# Patient Record
Sex: Male | Born: 1955 | Race: Black or African American | Hispanic: No | Marital: Married | Smoking: Former smoker
Health system: Southern US, Community
[De-identification: ages and names within clinical notes are randomized; demographics above are authoritative.]

## PROBLEM LIST (undated history)

## (undated) DIAGNOSIS — E785 Hyperlipidemia, unspecified: Secondary | ICD-10-CM

## (undated) HISTORY — PX: SHOULDER ARTHROSCOPY: SHX128

## (undated) HISTORY — PX: COLONOSCOPY: SHX174

## (undated) HISTORY — PX: OTHER SURGICAL HISTORY: SHX169

## (undated) HISTORY — DX: Hyperlipidemia, unspecified: E78.5

---

## 2004-08-27 ENCOUNTER — Ambulatory Visit (HOSPITAL_BASED_OUTPATIENT_CLINIC_OR_DEPARTMENT_OTHER): Admission: RE | Admit: 2004-08-27 | Discharge: 2004-08-28 | Payer: Self-pay | Admitting: Orthopedic Surgery

## 2004-08-27 ENCOUNTER — Ambulatory Visit (HOSPITAL_COMMUNITY): Admission: RE | Admit: 2004-08-27 | Discharge: 2004-08-27 | Payer: Self-pay | Admitting: Orthopedic Surgery

## 2017-06-29 ENCOUNTER — Emergency Department (HOSPITAL_BASED_OUTPATIENT_CLINIC_OR_DEPARTMENT_OTHER)
Admission: EM | Admit: 2017-06-29 | Discharge: 2017-06-29 | Disposition: A | Discharge status: Home / Self Care | Payer: Worker's Compensation | Attending: Emergency Medicine | Admitting: Emergency Medicine

## 2017-06-29 ENCOUNTER — Other Ambulatory Visit: Payer: Self-pay

## 2017-06-29 ENCOUNTER — Emergency Department (HOSPITAL_BASED_OUTPATIENT_CLINIC_OR_DEPARTMENT_OTHER): Payer: Worker's Compensation

## 2017-06-29 DIAGNOSIS — S4992XA Unspecified injury of left shoulder and upper arm, initial encounter: Secondary | ICD-10-CM

## 2017-06-29 DIAGNOSIS — Y998 Other external cause status: Secondary | ICD-10-CM | POA: Insufficient documentation

## 2017-06-29 DIAGNOSIS — I10 Essential (primary) hypertension: Secondary | ICD-10-CM | POA: Diagnosis not present

## 2017-06-29 DIAGNOSIS — W000XXA Fall on same level due to ice and snow, initial encounter: Secondary | ICD-10-CM | POA: Diagnosis not present

## 2017-06-29 DIAGNOSIS — Y929 Unspecified place or not applicable: Secondary | ICD-10-CM | POA: Insufficient documentation

## 2017-06-29 DIAGNOSIS — Y9301 Activity, walking, marching and hiking: Secondary | ICD-10-CM | POA: Insufficient documentation

## 2017-06-29 DIAGNOSIS — O161 Unspecified maternal hypertension, first trimester: Secondary | ICD-10-CM

## 2017-06-29 NOTE — ED Triage Notes (Signed)
Pt slipped on ice this morning and fell on his left shoulder

## 2017-06-29 NOTE — Discharge Instructions (Signed)
Follow up with the sports medicine doctor as soon as possible

## 2017-06-29 NOTE — ED Notes (Signed)
PMS intact before and after. Pt tolerated well. All questions answered. 

## 2017-06-29 NOTE — ED Provider Notes (Signed)
[facility] EMERGENCY DEPARTMENT Provider Note   CSN: [id number] Arrival date & time: 06/29/17  1028     History   Chief Complaint Chief Complaint  Patient presents with  . Shoulder Pain    HPI Isaac Chan is a 62 y.o. male presents the for evaluation of left shoulder injury.  Patient was at work this morning when he slipped on ice falling onto his left shoulder.  He denies hitting his head or losing consciousness.  He denies numbness, tingling or weakness.  He had an injury to the same shoulder about 20 years ago however has had no problems with it since the original injury.  Patient is noted to be hypertensive.  He states that he has not been diagnosed with this before.  He is uninsured and does not follow with a primary care physician.  He denies chest pain, shortness of breath.  HPI  No past medical history on file.  There are no active problems to display for this patient.        Home Medications    Prior to Admission medications   Not on File    Family History No family history on file.  Social History Social History   Tobacco Use  . Smoking status: Not on file  Substance Use Topics  . Alcohol use: Not on file  . Drug use: Not on file     Allergies   Patient has no known allergies.   Review of Systems Review of Systems Ten systems reviewed and are negative for acute change, except as noted in the HPI.    Physical Exam Updated Vital Signs BP (!) 194/112 (BP Location: Right Arm) Comment: PA made aware  Pulse (!) 51   Temp 98 F (36.7 C) (Oral)   Resp 16   Ht 5\' 9"  (1.753 m)   Wt 77.6 kg (171 lb)   SpO2 100%   BMI 25.25 kg/m   Physical Exam  Physical Exam  Nursing note and vitals reviewed. Constitutional: He appears well-developed and well-nourished. No distress.  HENT:  Head: Normocephalic and atraumatic.  Eyes: Conjunctivae normal are normal. No scleral icterus.  Neck: Normal range of motion. Neck supple.    Cardiovascular: Normal rate, regular rhythm and normal heart sounds.   Pulmonary/Chest: Effort normal and breath sounds normal. No respiratory distress.  Abdominal: Soft. There is no tenderness.  Musculoskeletal: He exhibits no edema. No tenderness to palpation of the structures of the left shoulder girdle, no deformities noted.  Pain with abduction and extension of the glenohumeral joint.  Normal and equal bilateral grip strength, neurovascularly intact Neurological: He is alert.  Skin: Skin is warm and dry. He is not diaphoretic.  Psychiatric: His behavior is normal.    ED Treatments / Results  Labs (all labs ordered are listed, but only abnormal results are displayed) Labs Reviewed - No data to display  EKG  EKG Interpretation None       Radiology Dg Shoulder Left  Result Date: 06/29/2017 CLINICAL DATA:  62 year old male with history of trauma to the left shoulder after slipping on ice this morning. EXAM: LEFT SHOULDER - 2+ VIEW COMPARISON:  None. FINDINGS: There is no evidence of fracture or dislocation. 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 Os acromiale (normal anatomical variant) incidentally noted. Soft tissues are unremarkable. IMPRESSION: Negative. Electronically Signed   By: Vinnie Langton M.D.   On: 06/29/2017 11:12    Procedures Procedures (including critical care time)  Medications Ordered  in ED Medications - No data to display   Initial Impression / Assessment and Plan / ED Course  I have reviewed the triage vital signs and the nursing notes.  Pertinent labs & imaging results that were available during my care of the patient were reviewed by me and considered in my medical decision making (see chart for details).     Patient X-Ray negative for obvious fracture or dislocation. Pain managed in ED. Pt advised to follow up with orthopedics if symptoms persist for possibility of missed fracture diagnosis. Patient given brace  while in ED, conservative therapy recommended and discussed. Patient will be dc home & is agreeable with above plan.   Final Clinical Impressions(s) / ED Diagnoses   Final diagnoses:  None    ED Discharge Orders    None       Margarita Mail, PA-C 06/29/17 1215    Fredia Sorrow, MD 07/01/17 1715

## 2017-07-02 ENCOUNTER — Ambulatory Visit (INDEPENDENT_AMBULATORY_CARE_PROVIDER_SITE_OTHER): Payer: Worker's Compensation | Admitting: Family Medicine

## 2017-07-02 ENCOUNTER — Encounter: Payer: Self-pay | Admitting: Family Medicine

## 2017-07-02 DIAGNOSIS — M25512 Pain in left shoulder: Secondary | ICD-10-CM

## 2017-07-02 DIAGNOSIS — S4992XA Unspecified injury of left shoulder and upper arm, initial encounter: Secondary | ICD-10-CM

## 2017-07-02 HISTORY — DX: Unspecified injury of left shoulder and upper arm, initial encounter: S49.92XA

## 2017-07-02 NOTE — Progress Notes (Addendum)
PCP: Patient, [name]  Subjective:   HPI: Patient is a 62 y.o. male here for left shoulder injury.  Patient reports on 1/21 he was at work, accidentally slipped on ice and landed directly onto left shoulder. Immediate pain and inability to move left arm fully. Took advil initially but nothing since. No night pain. Wearing sling from the ED. Right handed. Pain level 2/10 and sharp if trying to reach overhead. No skin changes, numbness.  History reviewed. No pertinent past medical history.  No current outpatient medications on file prior to visit.   No current facility-administered medications on file prior to visit.     History reviewed. No pertinent surgical history.  No Known Allergies  Social History   Socioeconomic History  . Marital status: Married    Spouse name: Not on file  . Number of children: Not on file  . Years of education: Not on file  . Highest education level: Not on file  Social Needs  . Financial resource strain: Not on file  . Food insecurity - worry: Not on file  . Food insecurity - inability: Not on file  . Transportation needs - medical: Not on file  . Transportation needs - non-medical: Not on file  Occupational History  . Not on file  Tobacco Use  . Smoking status: Current Every Day Smoker  . Smokeless tobacco: Never Used  Substance and Sexual Activity  . Alcohol use: Not on file  . Drug use: Not on file  . Sexual activity: Not on file  Other Topics Concern  . Not on file  Social History Narrative  . Not on file    History reviewed. No pertinent family history.  BP (!) 174/102   Pulse (!) 54   Ht 5\' 9"  (1.753 m)   Wt 171 lb (77.6 kg)   BMI 25.25 kg/m   Review of Systems: See HPI above.     Objective:  Physical Exam:  Gen: NAD, comfortable in exam room  Left shoulder: No swelling, ecchymoses.  No gross deformity. No TTP. Full IR and ER.  Difficulty with abduction beyond 30 degrees, very slow and involves flexion to  get to 90 degrees. Negative Hawkins, Neers. Negative Yergasons. Strength 5/5 with IR.  3/5 strength with empty can, 4/5 ER. Negative apprehension. NV intact distally.  Right shoulder: No swelling, ecchymoses.  No gross deformity. No TTP. FROM. Strength 5/5 with empty can and resisted internal/external rotation. NV intact distally.   MSK u/s:  Biceps tendon intact on long and trans views.  AC joint normal.  Subscapularis appears normal without impingement.  Infraspinatus very thin near insertion and does not move fluidly with ER, thickened proximally suggesting mid-substance tear.  Supraspinatus difficult to visualize with a lot of fluid at insertion on footplate - concerning for retracted tear.  Assessment & Plan:  1. Left shoulder injury - Independently reviewed radiographs and no evidence fracture.  Due to a fall at work directly onto his left shoulder.  Exam and ultrasound concerning for tears of supraspinatus and infraspinatus.  Will go ahead with MRI to assess.  Icing, ibuprofen and/or tylenol.  Sling as needed.    Addendum:  MRI reviewed and discussed with patient.  Unfortunately confirmed full thickness supraspinatus and infraspinatus tears.  We will refer him to ortho to discuss operative repair - discussed with 4cm of retraction this will be more difficult.

## 2017-07-02 NOTE — Assessment & Plan Note (Signed)
Independently reviewed radiographs and no evidence fracture.  Due to a fall at work directly onto his left shoulder.  Exam and ultrasound concerning for tears of supraspinatus and infraspinatus.  Will go ahead with MRI to assess.  Icing, ibuprofen and/or tylenol.  Sling as needed.

## 2017-07-02 NOTE — Patient Instructions (Signed)
I'm concerned you tore two of your rotator cuff muscles. We will go ahead with an MRI to further assess - I will call you with the results and next steps. Icing 15 minutes at a time 3-4 times a day. Ibuprofen and/or tylenol if needed for pain. Use sling as needed. Follow up will depend on the MRI results.

## 2017-07-03 ENCOUNTER — Ambulatory Visit: Payer: Self-pay

## 2017-07-03 DIAGNOSIS — S4992XA Unspecified injury of left shoulder and upper arm, initial encounter: Secondary | ICD-10-CM

## 2017-07-03 NOTE — Addendum Note (Signed)
Addended by: Cyd Silence on: 07/03/2017 11:23 AM   Modules accepted: Orders

## 2017-07-03 NOTE — Addendum Note (Signed)
Addended by: Sherrie George F on: 07/03/2017 09:17 AM   Modules accepted: Orders

## 2017-07-03 NOTE — Addendum Note (Signed)
Addended by: Cyd Silence on: 07/03/2017 10:49 AM   Modules accepted: Orders

## 2017-07-06 NOTE — Addendum Note (Signed)
Addended by: Sherrie George F on: 07/06/2017 10:43 AM   Modules accepted: Orders

## 2017-07-13 NOTE — Addendum Note (Signed)
Addended by: Sherrie George F on: 07/13/2017 01:51 PM   Modules accepted: Orders

## 2017-07-15 ENCOUNTER — Encounter: Payer: Self-pay | Admitting: Family Medicine

## 2017-12-17 ENCOUNTER — Other Ambulatory Visit: Payer: Self-pay

## 2017-12-17 ENCOUNTER — Encounter (HOSPITAL_BASED_OUTPATIENT_CLINIC_OR_DEPARTMENT_OTHER): Payer: Self-pay | Admitting: Emergency Medicine

## 2017-12-17 ENCOUNTER — Emergency Department (HOSPITAL_BASED_OUTPATIENT_CLINIC_OR_DEPARTMENT_OTHER)
Admission: EM | Admit: 2017-12-17 | Discharge: 2017-12-17 | Disposition: A | Discharge status: Home / Self Care | Payer: Self-pay | Attending: Emergency Medicine | Admitting: Emergency Medicine

## 2017-12-17 DIAGNOSIS — Y999 Unspecified external cause status: Secondary | ICD-10-CM | POA: Insufficient documentation

## 2017-12-17 DIAGNOSIS — F17200 Nicotine dependence, unspecified, uncomplicated: Secondary | ICD-10-CM | POA: Insufficient documentation

## 2017-12-17 DIAGNOSIS — S39012A Strain of muscle, fascia and tendon of lower back, initial encounter: Secondary | ICD-10-CM | POA: Insufficient documentation

## 2017-12-17 DIAGNOSIS — X58XXXA Exposure to other specified factors, initial encounter: Secondary | ICD-10-CM | POA: Insufficient documentation

## 2017-12-17 DIAGNOSIS — Y929 Unspecified place or not applicable: Secondary | ICD-10-CM | POA: Insufficient documentation

## 2017-12-17 DIAGNOSIS — Y939 Activity, unspecified: Secondary | ICD-10-CM | POA: Insufficient documentation

## 2017-12-17 MED ORDER — CYCLOBENZAPRINE HCL 10 MG PO TABS: 10.0000 mg | ORAL_TABLET | Freq: Two times a day (BID) | ORAL | 0 refills | Status: DC | PRN

## 2017-12-17 NOTE — ED Triage Notes (Signed)
R lower back pain since yesterday. Denies injury.

## 2017-12-17 NOTE — ED Notes (Signed)
Pt. Reports he got up yesterday morning and took a shower and his lower back started to hurt and now he has pain with walking.  Not as bad today as yesterday.  Pt. Went on to work but could not stay he reports.

## 2017-12-17 NOTE — Discharge Instructions (Addendum)
Continue aleve for pain. Flexeril for muscle spasms. Follow up with a family doctor. Return if worsening.

## 2017-12-17 NOTE — ED Provider Notes (Signed)
[name] EMERGENCY DEPARTMENT Provider Note   CSN: [id number] Arrival date & time: 12/17/17  1230     History   Chief Complaint Chief Complaint  Patient presents with  . Back Pain    HPI Isaac Chan is a 62 y.o. male.  HPI Isaac Chan is a 62 y.o. male presents to emergency department with complaint of back pain.  Patient states that he noted lower back pain yesterday, states that time was severe.  Today the pain is better.  He took Advil which has helped.  He states pain is worsened with movement.  Denies any urinary symptoms.  Denies any flank pain.  Denies any hematuria.  No abdominal pain.  No injuries.  States pain does not radiate to his extremities.  No numbness or weakness in his legs.  No saddle anesthesia.  No difficulty controlling his bowels or bladder.  No fever or chills.  He does not use IV drugs  History reviewed. No pertinent past medical history.  Patient Active Problem List   Diagnosis Date Noted  . Shoulder injury, left, initial encounter 07/02/2017    Past Surgical History:  Procedure Laterality Date  . SHOULDER ARTHROSCOPY Left         Home Medications    Prior to Admission medications   Medication Sig Start Date End Date Taking? Authorizing Provider  cyclobenzaprine (FLEXERIL) 10 MG tablet Take 1 tablet (10 mg total) by mouth 2 (two) times daily as needed for muscle spasms. 12/17/17   Jeannett Senior, PA-C    Family History No family history on file.  Social History Social History   Tobacco Use  . Smoking status: Current Every Day Smoker  . Smokeless tobacco: Never Used  Substance Use Topics  . Alcohol use: Not on file  . Drug use: Not on file     Allergies   Patient has no known allergies.   Review of Systems Review of Systems  Constitutional: Negative for chills and fever.  Respiratory: Negative for cough, chest tightness and shortness of breath.   Cardiovascular: Negative for chest pain, palpitations and  leg swelling.  Gastrointestinal: Negative for abdominal distention, abdominal pain, diarrhea, nausea and vomiting.  Genitourinary: Negative for dysuria, flank pain, frequency, hematuria and urgency.  Musculoskeletal: Positive for arthralgias and back pain. Negative for myalgias, neck pain and neck stiffness.  Skin: Negative for rash.  Allergic/Immunologic: Negative for immunocompromised state.  Neurological: Negative for dizziness, weakness, light-headedness, numbness and headaches.  All other systems reviewed and are negative.    Physical Exam Updated Vital Signs BP (!) 186/112 (BP Location: Right Arm)   Pulse (!) 50   Temp 98.3 F (36.8 C) (Oral)   Resp 18   Ht 5\' 9"  (1.753 m)   Wt 71.7 kg (158 lb)   SpO2 100%   BMI 23.33 kg/m   Physical Exam  Constitutional: He appears well-developed and well-nourished. No distress.  HENT:  Head: Normocephalic and atraumatic.  Eyes: Conjunctivae are normal.  Neck: Normal range of motion. Neck supple.  Cardiovascular: Normal rate, regular rhythm and normal heart sounds.  Pulmonary/Chest: Effort normal and breath sounds normal. No respiratory distress. He has no wheezes. He has no rales.  Abdominal: Soft. Bowel sounds are normal. He exhibits no distension. There is no tenderness. There is no rebound.  Musculoskeletal: He exhibits no edema.  ttp diffusely over lumbar back muscles. No midline tenderness. No pain with bilateral straight leg raise  Neurological: He is alert.  5/5 and equal lower  extremity strength. 2+ and equal patellar reflexes bilaterally. Pt able to dorsiflex bilateral toes and feet with good strength against resistance. Equal sensation bilaterally over thighs and lower legs.   Skin: Skin is warm and dry.  Nursing note and vitals reviewed.    ED Treatments / Results  Labs (all labs ordered are listed, but only abnormal results are displayed) Labs Reviewed - No data to display  EKG None  Radiology No results  found.  Procedures Procedures (including critical care time)  Medications Ordered in ED Medications - No data to display   Initial Impression / Assessment and Plan / ED Course  I have reviewed the triage vital signs and the nursing notes.  Pertinent labs & imaging results that were available during my care of the patient were reviewed by me and considered in my medical decision making (see chart for details).     Patient with atraumatic lower back pain.  No abdominal pain or tenderness.  No neuro deficits on exam.  No concern for cauda equina, no numbness or weakness in extremities, no loss of sensation to perineum, no trouble controlling bowels or bladder.  Patient does not have a fever.  He is hypertensive, and will follow up with his primary care doctor.  Will treat with NSAIDs and muscle relaxants.  Most likely muscular pain, states he feels like spasms.  States pain today much improved from yesterday.  He is ambulatory in the department with no difficulty.  Stable for discharge home with close outpatient follow-up.  Return precautions discussed.  Vitals:   12/17/17 1234 12/17/17 1523  BP: (!) 157/98 (!) 186/112  Pulse: (!) 59 (!) 50  Resp: 18 18  Temp: 98.1 F (36.7 C) 98.3 F (36.8 C)  TempSrc: Oral Oral  SpO2: 99% 100%  Weight: 71.7 kg (158 lb)   Height: 5\' 9"  (1.753 m)      Final Clinical Impressions(s) / ED Diagnoses   Final diagnoses:  Strain of lumbar region, initial encounter    ED Discharge Orders        Ordered    cyclobenzaprine (FLEXERIL) 10 MG tablet  2 times daily PRN     12/17/17 1516       Jeannett Senior, PA-C 12/17/17 1953    Quintella Reichert, MD 12/18/17 [phone]

## 2020-05-17 ENCOUNTER — Emergency Department (HOSPITAL_BASED_OUTPATIENT_CLINIC_OR_DEPARTMENT_OTHER): Payer: Self-pay

## 2020-05-17 ENCOUNTER — Other Ambulatory Visit: Payer: Self-pay

## 2020-05-17 ENCOUNTER — Emergency Department (HOSPITAL_BASED_OUTPATIENT_CLINIC_OR_DEPARTMENT_OTHER)
Admission: EM | Admit: 2020-05-17 | Discharge: 2020-05-17 | Disposition: A | Discharge status: Home / Self Care | Payer: Self-pay | Attending: Emergency Medicine | Admitting: Emergency Medicine

## 2020-05-17 ENCOUNTER — Encounter (HOSPITAL_BASED_OUTPATIENT_CLINIC_OR_DEPARTMENT_OTHER): Payer: Self-pay | Admitting: Emergency Medicine

## 2020-05-17 DIAGNOSIS — I10 Essential (primary) hypertension: Secondary | ICD-10-CM | POA: Insufficient documentation

## 2020-05-17 LAB — CBC WITH DIFFERENTIAL/PLATELET
Abs Immature Granulocytes: 0.01 10*3/uL (ref 0.00–0.07)
Basophils Absolute: 0 10*3/uL (ref 0.0–0.1)
Basophils Relative: 1 %
Eosinophils Absolute: 0.1 10*3/uL (ref 0.0–0.5)
Eosinophils Relative: 2 %
HCT: 44.9 % (ref 39.0–52.0)
Hemoglobin: 16.2 g/dL (ref 13.0–17.0)
Immature Granulocytes: 0 %
Lymphocytes Relative: 37 %
Lymphs Abs: 1.9 10*3/uL (ref 0.7–4.0)
MCH: 27.6 pg (ref 26.0–34.0)
MCHC: 36.1 g/dL — ABNORMAL HIGH (ref 30.0–36.0)
MCV: 76.4 fL — ABNORMAL LOW (ref 80.0–100.0)
Monocytes Absolute: 0.5 10*3/uL (ref 0.1–1.0)
Monocytes Relative: 9 %
Neutro Abs: 2.5 10*3/uL (ref 1.7–7.7)
Neutrophils Relative %: 51 %
Platelets: 211 10*3/uL (ref 150–400)
RBC: 5.88 MIL/uL — ABNORMAL HIGH (ref 4.22–5.81)
RDW: 13.8 % (ref 11.5–15.5)
WBC: 5 10*3/uL (ref 4.0–10.5)
nRBC: 0 % (ref 0.0–0.2)

## 2020-05-17 LAB — BASIC METABOLIC PANEL
Anion gap: 11 (ref 5–15)
BUN: 18 mg/dL (ref 8–23)
CO2: 24 mmol/L (ref 22–32)
Calcium: 9.3 mg/dL (ref 8.9–10.3)
Chloride: 100 mmol/L (ref 98–111)
Creatinine, Ser: 1.17 mg/dL (ref 0.61–1.24)
GFR, Estimated: >60 mL/min (ref >60)
Glucose, Bld: 115 mg/dL — ABNORMAL HIGH (ref 70–99)
Potassium: 4.1 mmol/L (ref 3.5–5.1)
Sodium: 135 mmol/L (ref 135–145)

## 2020-05-17 LAB — URINALYSIS, ROUTINE W REFLEX MICROSCOPIC
Glucose, UA: NEGATIVE mg/dL
Hgb urine dipstick: NEGATIVE
Ketones, ur: 40 mg/dL — AB
Nitrite: NEGATIVE
Protein, ur: NEGATIVE mg/dL
Specific Gravity, Urine: >1.03 — ABNORMAL HIGH (ref 1.005–1.030)
pH: 5.5 (ref 5.0–8.0)

## 2020-05-17 LAB — URINALYSIS, MICROSCOPIC (REFLEX)

## 2020-05-17 LAB — TROPONIN I (HIGH SENSITIVITY): Troponin I (High Sensitivity): 13 ng/L (ref <18)

## 2020-05-17 MED ORDER — AMLODIPINE BESYLATE 5 MG PO TABS: 5.0000 mg | ORAL_TABLET | Freq: Once | ORAL | Status: DC

## 2020-05-17 MED ORDER — HYDROCHLOROTHIAZIDE 25 MG PO TABS: 25.0000 mg | ORAL_TABLET | Freq: Every day | ORAL | 0 refills | Status: DC

## 2020-05-17 MED ORDER — HYDROCHLOROTHIAZIDE 25 MG PO TABS
25.0000 mg | ORAL_TABLET | Freq: Once | ORAL | Status: AC
  Administered 2020-05-17: 25 mg via ORAL
  Filled 2020-05-17: qty 1

## 2020-05-17 NOTE — ED Notes (Signed)
Pt on monitor and vitals cycling 

## 2020-05-17 NOTE — ED Notes (Signed)
Patient transported to X-ray 

## 2020-05-17 NOTE — ED Triage Notes (Signed)
Pt c/o "feeling swimmy headed" last night and noted his BP to be high. He states he feeling lasted 1 hour. After resting they symptoms subsided but the BP remain high.

## 2020-05-17 NOTE — Discharge Instructions (Signed)
Please schedule a follow-up appointment with both your primary care doctor as well as cardiology.  Discussed your blood pressure management as well as your mildly low heart rate.  If you have any episodes of dizziness, speech change, vision change, any shortness of breath, chest pain or episodes of passing out, please return immediately to the emergency room for reassessment.

## 2020-05-17 NOTE — ED Provider Notes (Signed)
[facility] EMERGENCY DEPARTMENT Provider Note   CSN: [id number] Arrival date & time: 05/17/20  0730     History Chief Complaint  Patient presents with  . Dizziness  . Hypertension    Isaac Chan is a 64 y.o. male.  Presents to ER with concern for elevated blood pressure and episode of dizziness.  Patient reports episode of dizziness occurred yesterday while at work.  Reports lasted approximately 1 hour and resolved.  No room spinning sensation, just lightheaded.  Did not have any vision changes, speech changes, numbness, weakness, difficulty walking.  Reports that he checked his blood pressure at the time of the symptoms and it was elevated, checked it multiple times throughout the night and this morning and continued to be elevated.  He denies known history of hypertension though he reports a couple years ago when he had a surgery he was told that he had high blood pressure.  He does not follow-up with a primary doctor regularly or monitor his blood pressure regularly.  HPI     History reviewed. No pertinent past medical history.  Patient Active Problem List   Diagnosis Date Noted  . Shoulder injury, left, initial encounter 07/02/2017    Past Surgical History:  Procedure Laterality Date  . SHOULDER ARTHROSCOPY Left        No family history on file.  Social History   Tobacco Use  . Smoking status: Current Every Day Smoker  . Smokeless tobacco: Never Used  Substance Use Topics  . Alcohol use: Yes  . Drug use: Not Currently    Home Medications Prior to Admission medications   Medication Sig Start Date End Date Taking? Authorizing Provider  cyclobenzaprine (FLEXERIL) 10 MG tablet Take 1 tablet (10 mg total) by mouth 2 (two) times daily as needed for muscle spasms. 12/17/17   Kirichenko, Tatyana, PA-C  hydrochlorothiazide (HYDRODIURIL) 25 MG tablet Take 1 tablet (25 mg total) by mouth daily. 05/17/20   Lucrezia Starch, MD    Allergies    Patient has no  known allergies.  Review of Systems   Review of Systems  Constitutional: Negative for chills and fever.  HENT: Negative for ear pain and sore throat.   Eyes: Negative for pain and visual disturbance.  Respiratory: Negative for cough and shortness of breath.   Cardiovascular: Negative for chest pain and palpitations.  Gastrointestinal: Negative for abdominal pain and vomiting.  Genitourinary: Negative for dysuria and hematuria.  Musculoskeletal: Negative for arthralgias and back pain.  Skin: Negative for color change and rash.  Neurological: Positive for dizziness and light-headedness. Negative for seizures and syncope.  All other systems reviewed and are negative.   Physical Exam Updated Vital Signs BP (!) 211/104   Pulse (!) 46   Temp 98.2 F (36.8 C)   Resp 16   Ht 5\' 9"  (1.753 m)   Wt 65 kg   SpO2 100%   BMI 21.16 kg/m   Physical Exam Vitals and nursing note reviewed.  Constitutional:      Appearance: He is well-developed and well-nourished.  HENT:     Head: Normocephalic and atraumatic.  Eyes:     Conjunctiva/sclera: Conjunctivae normal.  Cardiovascular:     Rate and Rhythm: Normal rate and regular rhythm.     Heart sounds: No murmur heard.   Pulmonary:     Effort: Pulmonary effort is normal. No respiratory distress.     Breath sounds: Normal breath sounds.  Abdominal:     Palpations: Abdomen is  soft.     Tenderness: There is no abdominal tenderness.  Musculoskeletal:        General: No deformity, signs of injury or edema.     Cervical back: Neck supple.  Skin:    General: Skin is warm and dry.  Neurological:     General: No focal deficit present.     Mental Status: He is alert and oriented to person, place, and time.     Comments: Normal strength and sensation in upper and lower extremities, speech clear  Psychiatric:        Mood and Affect: Mood and affect and mood normal.        Behavior: Behavior normal.     ED Results / Procedures / Treatments    Labs (all labs ordered are listed, but only abnormal results are displayed) Labs Reviewed  CBC WITH DIFFERENTIAL/PLATELET - Abnormal; Notable for the following components:      Result Value   RBC 5.88 (*)    MCV 76.4 (*)    MCHC 36.1 (*)    All other components within normal limits  BASIC METABOLIC PANEL - Abnormal; Notable for the following components:   Glucose, Bld 115 (*)    All other components within normal limits  URINALYSIS, ROUTINE W REFLEX MICROSCOPIC - Abnormal; Notable for the following components:   Specific Gravity, Urine >1.030 (*)    Bilirubin Urine SMALL (*)    Ketones, ur 40 (*)    Leukocytes,Ua TRACE (*)    All other components within normal limits  URINALYSIS, MICROSCOPIC (REFLEX) - Abnormal; Notable for the following components:   Bacteria, UA MANY (*)    All other components within normal limits  TROPONIN I (HIGH SENSITIVITY)    EKG EKG Interpretation  Date/Time:  Thursday May 17 2020 07:57:43 EST Ventricular Rate:  49 PR Interval:    QRS Duration: 102 QT Interval:  486 QTC Calculation: 439 R Axis:   71 Text Interpretation: Sinus bradycardia Probable left atrial enlargement LVH with secondary repolarization abnormality Anterior ST elevation, probably due to LVH Baseline wander in lead(s) V6 Confirmed by Madalyn Rob [phone]) on 05/17/2020 7:59:12 AM   Radiology DG Chest 2 View  Result Date: 05/17/2020 CLINICAL DATA:  Repeat with nipple markers EXAM: CHEST - 2 VIEW COMPARISON:  None. FINDINGS: The nodular density projecting over the left lower lobe on prior study corresponds to the nipple marker. No suspicious pulmonary nodules. No confluent opacities or effusions. Heart is normal size. No acute bony abnormality. IMPRESSION: Nodular density projecting over the left lower lung corresponds to the nipple marker and nipple shadow. No acute findings. Electronically Signed   By: Rolm Baptise M.D.   On: 05/17/2020 09:25   DG Chest 2 View  Result  Date: 05/17/2020 CLINICAL DATA:  Hypertension. EXAM: CHEST - 2 VIEW COMPARISON:  No prior. FINDINGS: Mediastinum and hilar structures normal. Heart size normal. No pulmonary venous congestion. No focal infiltrate. Nodular opacity noted over the left lung base. This may represent a nipple shadow. Repeat PA and lateral chest x-ray with nipple markers suggested. No pleural effusion or pneumothorax. Slightly proud bowel with air-fluid levels noted. Abdominal series should be considered for further evaluation. Degenerative changes scoliosis thoracic spine. Degenerative changes both shoulders. IMPRESSION: 1. No acute cardiopulmonary disease identified. 2. Nodular opacity noted over the left lung base. This may represent a nipple shadow. Repeat PA and lateral chest x-ray with nipple markers suggested. 3. Slightly prominent bowel with air-fluid levels noted. Abdominal series should be  considered for further evaluation. Electronically Signed   By: Marcello Moores  Register   On: 05/17/2020 08:32   CT Head Wo Contrast  Result Date: 05/17/2020 CLINICAL DATA:  Vertigo, dizziness. EXAM: CT HEAD WITHOUT CONTRAST TECHNIQUE: Contiguous axial images were obtained from the base of the skull through the vertex without intravenous contrast. COMPARISON:  None. FINDINGS: Brain: Mild diffuse cortical atrophy is noted. No mass effect or midline shift is noted. Ventricular size is within normal limits. There is no evidence of mass lesion, hemorrhage or acute infarction. Vascular: No hyperdense vessel or unexpected calcification. Skull: Normal. Negative for fracture or focal lesion. Sinuses/Orbits: Mild bilateral ethmoid sinusitis is noted. Other: None. IMPRESSION: Mild diffuse cortical atrophy. Mild bilateral ethmoid sinusitis. No acute intracranial abnormality seen. Electronically Signed   By: Marijo Conception M.D.   On: 05/17/2020 08:32    Procedures Procedures (including critical care time)  Medications Ordered in ED Medications   hydrochlorothiazide (HYDRODIURIL) tablet 25 mg (25 mg Oral Given 05/17/20 1610)    ED Course  I have reviewed the triage vital signs and the nursing notes.  Pertinent labs & imaging results that were available during my care of the patient were reviewed by me and considered in my medical decision making (see chart for details).  Clinical Course as of 05/17/20 1017  Thu May 17, 2020  0902 DG Chest 2 View [RD]    Clinical Course User Index [RD] Lucrezia Starch, MD   MDM Rules/Calculators/A&P                         64 year old male presents to ER with concern for episode of dizziness/lightheadedness yesterday as well as high blood pressure.  Regarding his episode yesterday, he denied room spinning sensation, denied difficulty with ambulation, no focal neurologic complaints or neurologic deficits on exam today.  CT head negative for acute pathology.  Given this work-up and these findings, low suspicion for acute neurologic process.  Electrolytes are within normal limits.  Blood counts grossly within normal limits.  Noted mild bradycardia but hypertensive, doubt this was causing his symptoms yesterday.  No arrhythmias noted on telemetry monitoring.  Regarding his blood pressure, patient does not routinely check his blood pressure, does not routinely follow with primary doctor.  Suspect this may be an incidental finding.  Given reassuring exam and no ongoing symptoms, suspect this may be an incidental finding.  EKG without acute ischemic change, basic labs grossly within normal limits.  Patient does not have any lab work or symptoms to suggest hypertensive crisis.  Stressed need to get a primary doctor.  Given his significant hypertension and bradycardia, believe he would also benefit from following up with cardiology.  Provided information for community care as well as cardiology.  Will initiate HCTZ for now.  Reviewed strict return precautions with patient and discharged home.  After the  discussed management above, the patient was determined to be safe for discharge.  The patient was in agreement with this plan and all questions regarding their care were answered.  ED return precautions were discussed and the patient will return to the ED with any significant worsening of condition.  Final Clinical Impression(s) / ED Diagnoses Final diagnoses:  Hypertension, unspecified type    Rx / DC Orders ED Discharge Orders         Ordered    hydrochlorothiazide (HYDRODIURIL) 25 MG tablet  Daily        05/17/20 1004  Ambulatory referral to Cardiology       Comments: Uncontrolled hypertension, bradycardia   05/17/20 1006           Lucrezia Starch, MD 05/17/20 1018

## 2020-05-22 ENCOUNTER — Other Ambulatory Visit: Payer: Self-pay

## 2020-05-23 ENCOUNTER — Ambulatory Visit: Payer: Self-pay

## 2020-05-23 ENCOUNTER — Other Ambulatory Visit: Payer: Self-pay

## 2020-05-23 ENCOUNTER — Encounter: Payer: Self-pay | Admitting: Cardiology

## 2020-05-23 ENCOUNTER — Ambulatory Visit (INDEPENDENT_AMBULATORY_CARE_PROVIDER_SITE_OTHER): Payer: Self-pay | Admitting: Cardiology

## 2020-05-23 VITALS — BP 180/120 | HR 63 | Ht 69.0 in | Wt 163.0 lb

## 2020-05-23 DIAGNOSIS — I10 Essential (primary) hypertension: Secondary | ICD-10-CM

## 2020-05-23 DIAGNOSIS — IMO0001 Reserved for inherently not codable concepts without codable children: Secondary | ICD-10-CM

## 2020-05-23 DIAGNOSIS — R0600 Dyspnea, unspecified: Secondary | ICD-10-CM

## 2020-05-23 DIAGNOSIS — R42 Dizziness and giddiness: Secondary | ICD-10-CM

## 2020-05-23 DIAGNOSIS — I499 Cardiac arrhythmia, unspecified: Secondary | ICD-10-CM

## 2020-05-23 DIAGNOSIS — R0609 Other forms of dyspnea: Secondary | ICD-10-CM

## 2020-05-23 DIAGNOSIS — F17200 Nicotine dependence, unspecified, uncomplicated: Secondary | ICD-10-CM | POA: Insufficient documentation

## 2020-05-23 DIAGNOSIS — R001 Bradycardia, unspecified: Secondary | ICD-10-CM

## 2020-05-23 HISTORY — DX: Nicotine dependence, unspecified, uncomplicated: F17.200

## 2020-05-23 HISTORY — DX: Bradycardia, unspecified: R00.1

## 2020-05-23 HISTORY — DX: Dizziness and giddiness: R42

## 2020-05-23 HISTORY — DX: Reserved for inherently not codable concepts without codable children: IMO0001

## 2020-05-23 HISTORY — DX: Dyspnea, unspecified: R06.00

## 2020-05-23 HISTORY — DX: Essential (primary) hypertension: I10

## 2020-05-23 HISTORY — DX: Other forms of dyspnea: R06.09

## 2020-05-23 MED ORDER — AMLODIPINE BESYLATE 10 MG PO TABS: 10.0000 mg | ORAL_TABLET | Freq: Every day | ORAL | 1 refills | Status: DC

## 2020-05-23 NOTE — Progress Notes (Signed)
Cardiology Consultation:    Date:  05/23/2020   ID:  Isaac Chan, DOB 1955/09/10, MRN [id number]  PCP:  Patient, [name]  Cardiologist:  Jenne Campus, MD   Referring MD: Lucrezia Starch, MD   Chief Complaint  Patient presents with  . Irregular Heart Beat  . Hypertension    History of Present Illness:    Isaac Chan is a 64 y.o. male who is being seen today for the evaluation of essential hypertension at the request of Lucrezia Starch, MD.  A week ago he ended up going to the emergency room the reason why he went there with the fact that he noticed to have high blood pressure as well as became dizzy he did not passed out he simply had unsteady gait.  Work-up in the emergency was negative,That work-up included CT of his head which was negative, biochemical markers for heart injury was negative.  He was put on hydrochlorothiazide and discharged home.  He comes today to be establish as a patient.  Overall he said since the time he was in the emergency room he is doing well dizziness that he suffered from lasted for about few hours and then is gone.  He works in [employer] physical work and have no difficulty doing it.  Denies have any chest pain tightness squeezing pressure burning chest does have some exertional shortness of breath, no swelling of lower extremities.  He never had a stroke.  Was never diagnosed with hypertension he does have a blood pressure monitor at home but very rarely checks his blood pressure. He does have family history of essential hypertension intermittent problems will probably numbers do take some medication for high blood pressure. He smokes some he said 1 pack usually last more week.  He also drink some beer. He does not exercise on the regular basis but his work required physical activity.  Past Medical History:  Diagnosis Date  . Shoulder injury, left, initial encounter 07/02/2017    Past Surgical History:  Procedure Laterality Date  .  SHOULDER ARTHROSCOPY Left     Current Medications: Current Meds  Medication Sig  . hydrochlorothiazide (HYDRODIURIL) 25 MG tablet Take 1 tablet (25 mg total) by mouth daily.     Allergies:   Patient has no known allergies.   Social History   Socioeconomic History  . Marital status: Married    Spouse name: Not on file  . Number of children: Not on file  . Years of education: Not on file  . Highest education level: Not on file  Occupational History  . Not on file  Tobacco Use  . Smoking status: Current Every Day Smoker  . Smokeless tobacco: Never Used  Substance and Sexual Activity  . Alcohol use: Yes  . Drug use: Not Currently  . Sexual activity: Not on file  Other Topics Concern  . Not on file  Social History Narrative  . Not on file   Social Determinants of Health   Financial Resource Strain: Not on file  Food Insecurity: Not on file  Transportation Needs: Not on file  Physical Activity: Not on file  Stress: Not on file  Social Connections: Not on file     Family History: The patient's family history is not on file. ROS:   Please see the history of present illness.    All 14 point review of systems negative except as described per history of present illness.  EKGs/Labs/Other Studies Reviewed:    The  following studies were reviewed today: I did review CT from the emergency room as well as records in the emergency room.  EKG:  EKG is  ordered today.  The ekg ordered today demonstrates sinus bradycardia, ventricle hypertrophy  Recent Labs: 05/17/2020: BUN 18; Creatinine, Ser 1.17; Hemoglobin 16.2; Platelets 211; Potassium 4.1; Sodium 135  Recent Lipid Panel No results found for: CHOL, TRIG, HDL, CHOLHDL, VLDL, LDLCALC, LDLDIRECT  Physical Exam:    VS:  BP (!) 180/120 (BP Location: Left Arm, Patient Position: Sitting)   Pulse 63   Ht 5\' 9"  (1.753 m)   Wt 163 lb (73.9 kg)   SpO2 94%   BMI 24.07 kg/m     Wt Readings from Last 3 Encounters:  05/23/20  163 lb (73.9 kg)  05/17/20 143 lb 4.8 oz (65 kg)  12/17/17 158 lb (71.7 kg)     GEN:  Well nourished, well developed in no acute distress HEENT: Normal NECK: No JVD; No carotid bruits LYMPHATICS: No lymphadenopathy CARDIAC: RRR, no murmurs, no rubs, no gallops RESPIRATORY:  Clear to auscultation without rales, wheezing or rhonchi  ABDOMEN: Soft, non-tender, non-distended MUSCULOSKELETAL:  No edema; No deformity  SKIN: Warm and dry NEUROLOGIC:  Alert and oriented x 3 PSYCHIATRIC:  Normal affect   ASSESSMENT:    1. Irregular heart beat   2. Essential hypertension   3. Dizziness   4. Bradycardia   5. Dyspnea on exertion   6. Smoking    PLAN:    In order of problems listed above:  1. Irregular heartbeats, bradycardia.  Obviously concerning.  His EKG today showed heart rate 51.  He described to have episode of dizziness that brought him to the emergency room but his heart rate was not excessively slow not enough to justify his dizziness.  He described however situation when he wakes up in the middle of the night sweating.  There is no nightmares, of course concern about potentially significant sinus bradycardia is there I think the best way to answer the question if there is a problem is to put him on her heel.  We will put Zio patch for 1 week. 2. Essential hypertension: Obviously concerning.  Still not well managed.  He is on diuretic which I will continue.  I will put him back on amlodipine that he does have written sometime ago but he did not take it.  I will check Chem-7 today I will see him back in my office in the next 3 to 4 weeks to continue management of his blood pressure.  We did talk about potentially doing echocardiogram, however, he does not have any insurance he does not want to pursue it now. 3. Dyspnea on exertion multifactorial.  Hopefully with better management of his blood pressure things will improve. 4. Smoking: We spent difficult time talking about this and  strongly recommend to quit likely he does not smoke much but ideally he should discontinue.  The same goes with his alcohol drinking.  I told him he need to quit.  I do have any indicators for him to have a sleep apnea, he does not snore he is not obese he does not have night. 5. We did talk about need to avoid salty food and hopefully with this weight loss can also control his blood pressure better.   Medication Adjustments/Labs and Tests Ordered: Current medicines are reviewed at length with the patient today.  Concerns regarding medicines are outlined above.  Orders Placed This Encounter  Procedures  .  EKG 12-Lead   No orders of the defined types were placed in this encounter.   Signed, Park Liter, MD, [facility]. 05/23/2020 11:09 AM    [place]

## 2020-05-23 NOTE — Patient Instructions (Signed)
Medication Instructions:  Your physician has recommended you make the following change in your medication:   START: amlodipine 10 mg daily   *If you need a refill on your cardiac medications before your next appointment, please call your pharmacy*   Lab Work: Your physician recommends that you return for lab work today: bmp  If you have labs (blood work) drawn today and your tests are completely normal, you will receive your results only by:  [place] (if you have MyChart) OR  A paper copy in the mail If you have any lab test that is abnormal or we need to change your treatment, we will call you to review the results.   Testing/Procedures: A zio monitor was ordered today. It will remain on for 7 days. You will then return monitor and event diary in provided box. It takes 1-2 weeks for report to be downloaded and returned to [place]. We will call you with the results. If monitor falls off or has orange flashing light, please call Zio for further instructions.      Follow-Up: At [facility], you and your health needs are our priority.  As part of our continuing mission to provide you with exceptional heart care, we have created designated Provider Care Teams.  These Care Teams include your primary Cardiologist (physician) and Advanced Practice Providers (APPs -  Physician Assistants and Nurse Practitioners) who all work together to provide you with the care you need, when you need it.  We recommend signing up for the patient portal called "MyChart".  Sign up information is provided on this After Visit Summary.  MyChart is used to connect with patients for Virtual Visits (Telemedicine).  Patients are able to view lab/test results, encounter notes, upcoming appointments, etc.  Non-urgent messages can be sent to your provider as well.   To learn more about what you can do with MyChart, go to [email or web address].    Your next appointment:   3 week(s)  The format for your next  appointment:   In Person  Provider:   Jenne Campus, MD   Other Instructions  Amlodipine Oral Tablets What is this medicine? AMLODIPINE (am LOE di peen) is a calcium channel blocker. It relaxes your blood vessels and decreases the amount of work the heart has to do. It treats high blood pressure and/or prevents chest pain (also called angina). This medicine may be used for other purposes; ask your health care provider or pharmacist if you have questions. COMMON BRAND NAME(S): Norvasc What should I tell my health care provider before I take this medicine? They need to know if you have any of these conditions:  heart disease  liver disease  an unusual or allergic reaction to amlodipine, other drugs, foods, dyes, or preservatives  pregnant or trying to get pregnant  breast-feeding How should I use this medicine? Take this drug by mouth. Take it as directed on the prescription label at the same time every day. You can take it with or without food. If it upsets your stomach, take it with food. Keep taking it unless your health care provider tells you to stop. Talk to your health care provider about the use of this drug in children. While it may be prescribed for children as young as 6 for selected conditions, precautions do apply. Overdosage: If you think you have taken too much of this medicine contact a poison control center or emergency room at once. NOTE: This medicine is only for you. Do not share this  medicine with others. What if I miss a dose? If you miss a dose, take it as soon as you can. If it is almost time for your next dose, take only that dose. Do not take double or extra doses. What may interact with this medicine? This medicine may interact with the following medications:  clarithromycin  cyclosporine  diltiazem  itraconazole  simvastatin  tacrolimus This list may not describe all possible interactions. Give your health care provider a list of all the  medicines, herbs, non-prescription drugs, or dietary supplements you use. Also tell them if you smoke, drink alcohol, or use illegal drugs. Some items may interact with your medicine. What should I watch for while using this medicine? Visit your health care provider for regular checks on your progress. Check your blood pressure as directed. Ask your health care provider what your blood pressure should be. Also, find out when you should contact him or her. Do not treat yourself for coughs, colds, or pain while you are using this drug without asking your health care provider for advice. Some drugs may increase your blood pressure. You may get drowsy or dizzy. Do not drive, use machinery, or do anything that needs mental alertness until you know how this drug affects you. Do not stand up or sit up quickly, especially if you are an older patient. This reduces the risk of dizzy or fainting spells. What side effects may I notice from receiving this medicine? Side effects that you should report to your doctor or health care provider as soon as possible:  allergic reactions (skin rash, itching or hives; swelling of the face, lips, or tongue)  heart attack (trouble breathing; pain or tightness in the chest, neck, back or arms; unusually weak or tired)  low blood pressure (dizziness; feeling faint or lightheaded, falls; unusually weak or tired) Side effects that usually do not require medical attention (report these to your doctor or health care provider if they continue or are bothersome):  facial flushing  nausea  palpitations  stomach pain  sudden weight gain  swelling of the ankles, feet, hands This list may not describe all possible side effects. Call your doctor for medical advice about side effects. You may report side effects to FDA at 1-800-FDA-1088. Where should I keep my medicine? Keep out of the reach of children and pets. Store at room temperature between 59 and 86 degrees F (15 and  30 degrees C). Protect from light and moisture. Keep the container tightly closed. Throw away any unused drug after the expiration date. NOTE: This sheet is a summary. It may not cover all possible information. If you have questions about this medicine, talk to your doctor, pharmacist, or health care provider.  2020 Elsevier/Gold Standard (2019-03-01 19:39:45)

## 2020-05-23 NOTE — Addendum Note (Signed)
Addended by: Senaida Ores on: 05/23/2020 11:36 AM   Modules accepted: Orders

## 2020-05-23 NOTE — Addendum Note (Signed)
Addended by: Senaida Ores on: 05/23/2020 11:25 AM   Modules accepted: Orders

## 2020-05-24 LAB — BASIC METABOLIC PANEL
BUN/Creatinine Ratio: 13 (ref 10–24)
BUN: 15 mg/dL (ref 8–27)
CO2: 24 mmol/L (ref 20–29)
Calcium: 9.6 mg/dL (ref 8.6–10.2)
Chloride: 101 mmol/L (ref 96–106)
Creatinine, Ser: 1.15 mg/dL (ref 0.76–1.27)
GFR calc Af Amer: 77 mL/min/{1.73_m2} (ref >59)
GFR calc non Af Amer: 67 mL/min/{1.73_m2} (ref >59)
Glucose: 101 mg/dL — ABNORMAL HIGH (ref 65–99)
Potassium: 4 mmol/L (ref 3.5–5.2)
Sodium: 140 mmol/L (ref 134–144)

## 2020-06-06 ENCOUNTER — Telehealth: Payer: Self-pay

## 2020-06-06 NOTE — Telephone Encounter (Signed)
Patient notified of results and verbalized understanding.  

## 2020-06-06 NOTE — Telephone Encounter (Signed)
-----   Message from Georgeanna Lea, MD sent at 05/24/2020 12:22 PM EST ----- His Chem-7 is fine, continue present management

## 2020-06-21 ENCOUNTER — Encounter: Payer: Self-pay | Admitting: Cardiology

## 2020-06-21 ENCOUNTER — Ambulatory Visit (INDEPENDENT_AMBULATORY_CARE_PROVIDER_SITE_OTHER): Payer: Self-pay | Admitting: Cardiology

## 2020-06-21 ENCOUNTER — Other Ambulatory Visit: Payer: Self-pay

## 2020-06-21 VITALS — BP 142/90 | HR 73 | Ht 69.0 in | Wt 151.0 lb

## 2020-06-21 DIAGNOSIS — F17200 Nicotine dependence, unspecified, uncomplicated: Secondary | ICD-10-CM

## 2020-06-21 DIAGNOSIS — R42 Dizziness and giddiness: Secondary | ICD-10-CM

## 2020-06-21 DIAGNOSIS — I10 Essential (primary) hypertension: Secondary | ICD-10-CM

## 2020-06-21 MED ORDER — HYDROCHLOROTHIAZIDE 25 MG PO TABS: 25.0000 mg | ORAL_TABLET | Freq: Every day | ORAL | 0 refills | Status: DC

## 2020-06-21 NOTE — Patient Instructions (Signed)
Medication Instructions:  Your physician recommends that you continue on your current medications as directed. Please refer to the Current Medication list given to you today.  *If you need a refill on your cardiac medications before your next appointment, please call your pharmacy*   Lab Work: Your physician recommends that you return for lab work today: bmp, lipid  If you have labs (blood work) drawn today and your tests are completely normal, you will receive your results only by: Marland Kitchen MyChart Message (if you have MyChart) OR . A paper copy in the mail If you have any lab test that is abnormal or we need to change your treatment, we will call you to review the results.   Testing/Procedures: None   Follow-Up: At [facility], you and your health needs are our priority.  As part of our continuing mission to provide you with exceptional heart care, we have created designated Provider Care Teams.  These Care Teams include your primary Cardiologist (physician) and Advanced Practice Providers (APPs -  Physician Assistants and Nurse Practitioners) who all work together to provide you with the care you need, when you need it.  We recommend signing up for the patient portal called "MyChart".  Sign up information is provided on this After Visit Summary.  MyChart is used to connect with patients for Virtual Visits (Telemedicine).  Patients are able to view lab/test results, encounter notes, upcoming appointments, etc.  Non-urgent messages can be sent to your provider as well.   To learn more about what you can do with MyChart, go to [email or web address].    Your next appointment:   3 month(s)  The format for your next appointment:   In Person  Provider:   Jenne Campus, MD   Other Instructions

## 2020-06-21 NOTE — Progress Notes (Signed)
Cardiology Office Note:    Date:  06/21/2020   ID:  Isaac Chan, DOB 12-15-1955, MRN [id number]  PCP:  Patient, [name]  Cardiologist:  Jenne Campus, MD    Referring MD: No ref. provider found   Chief Complaint  Patient presents with  . Follow-up  Am doing fine  History of Present Illness:    Isaac Chan is a 65 y.o. male who showed up in the emergency room last year because of some dizziness.  He was also found to relatively bradycardic at a rate of 51 as well as very high blood pressure he was eventually sent home and asked to follow-up with me.  He is doing well.  Denies have any chest pain tightness squeezing pressure burning chest.  He takes all his medication which he should except for the fact that he ran out of his reported anxiety.  He did wear monitor which showed some bradycardia but none of this significant.  Overall clinically doing well.  Past Medical History:  Diagnosis Date  . Bradycardia 05/23/2020  . Dizziness 05/23/2020  . Dyspnea on exertion 05/23/2020  . Essential hypertension 05/23/2020  . Shoulder injury, left, initial encounter 07/02/2017  . Smoking 05/23/2020    Past Surgical History:  Procedure Laterality Date  . SHOULDER ARTHROSCOPY Left     Current Medications: Current Meds  Medication Sig  . amLODipine (NORVASC) 10 MG tablet Take 1 tablet (10 mg total) by mouth daily.  . hydrochlorothiazide (HYDRODIURIL) 25 MG tablet Take 1 tablet (25 mg total) by mouth daily.     Allergies:   Patient has no known allergies.   Social History   Socioeconomic History  . Marital status: Married    Spouse name: Not on file  . Number of children: Not on file  . Years of education: Not on file  . Highest education level: Not on file  Occupational History  . Not on file  Tobacco Use  . Smoking status: Current Every Day Smoker  . Smokeless tobacco: Never Used  Substance and Sexual Activity  . Alcohol use: Yes  . Drug use: Not Currently  . Sexual  activity: Not on file  Other Topics Concern  . Not on file  Social History Narrative  . Not on file   Social Determinants of Health   Financial Resource Strain: Not on file  Food Insecurity: Not on file  Transportation Needs: Not on file  Physical Activity: Not on file  Stress: Not on file  Social Connections: Not on file     Family History: The patient's family history is not on file. ROS:   Please see the history of present illness.    All 14 point review of systems negative except as described per history of present illness  EKGs/Labs/Other Studies Reviewed:      Recent Labs: 05/17/2020: Hemoglobin 16.2; Platelets 211 05/23/2020: BUN 15; Creatinine, Ser 1.15; Potassium 4.0; Sodium 140  Recent Lipid Panel No results found for: CHOL, TRIG, HDL, CHOLHDL, VLDL, LDLCALC, LDLDIRECT  Physical Exam:    VS:  BP (!) 142/90 (BP Location: Right Arm, Patient Position: Sitting)   Pulse 73   Ht 5\' 9"  (1.753 m)   Wt 151 lb (68.5 kg)   SpO2 97%   BMI 22.30 kg/m     Wt Readings from Last 3 Encounters:  06/21/20 151 lb (68.5 kg)  05/23/20 163 lb (73.9 kg)  05/17/20 143 lb 4.8 oz (65 kg)     GEN:  Well nourished,  well developed in no acute distress HEENT: Normal NECK: No JVD; No carotid bruits LYMPHATICS: No lymphadenopathy CARDIAC: RRR, no murmurs, no rubs, no gallops RESPIRATORY:  Clear to auscultation without rales, wheezing or rhonchi  ABDOMEN: Soft, non-tender, non-distended MUSCULOSKELETAL:  No edema; No deformity  SKIN: Warm and dry LOWER EXTREMITIES: no swelling NEUROLOGIC:  Alert and oriented x 3 PSYCHIATRIC:  Normal affect   ASSESSMENT:    1. Essential hypertension   2. Dizziness   3. Smoking    PLAN:    In order of problems listed above:  1. Essential hypertension: Uncontrolled.  He brought blood pressure measurements to me well and he was taking hydrochlorothiazide still majority of time his blood pressure is elevated at the high and an acceptable  level.  Therefore, we will do Chem-7 today if Chem-7 is fine I will start him with losartan 50 mg daily, but now also will restart him with hydrochlorothiazide. 2. Dizziness denies having any. 3. Smoking: We spent at least 5 minutes talking about this and I strongly recommended him to quit. 4. Dyslipidemia: Unknown.  We will check his fasting lipid profile today.   Medication Adjustments/Labs and Tests Ordered: Current medicines are reviewed at length with the patient today.  Concerns regarding medicines are outlined above.  No orders of the defined types were placed in this encounter.  Medication changes: No orders of the defined types were placed in this encounter.   Signed, Park Liter, MD, [facility] 06/21/2020 8:32 AM    [place]

## 2020-06-21 NOTE — Addendum Note (Signed)
Addended by: Senaida Ores on: 06/21/2020 08:41 AM   Modules accepted: Orders

## 2020-06-22 ENCOUNTER — Telehealth: Payer: Self-pay

## 2020-06-22 LAB — BASIC METABOLIC PANEL
BUN/Creatinine Ratio: 21 (ref 10–24)
BUN: 19 mg/dL (ref 8–27)
CO2: 20 mmol/L (ref 20–29)
Calcium: 9.4 mg/dL (ref 8.6–10.2)
Chloride: 100 mmol/L (ref 96–106)
Creatinine, Ser: 0.91 mg/dL (ref 0.76–1.27)
GFR calc Af Amer: 103 mL/min/{1.73_m2} (ref >59)
GFR calc non Af Amer: 89 mL/min/{1.73_m2} (ref >59)
Glucose: 94 mg/dL (ref 65–99)
Potassium: 4 mmol/L (ref 3.5–5.2)
Sodium: 136 mmol/L (ref 134–144)

## 2020-06-22 LAB — LIPID PANEL
Chol/HDL Ratio: 2.8 ratio (ref 0.0–5.0)
Cholesterol, Total: 232 mg/dL — ABNORMAL HIGH (ref 100–199)
HDL: 84 mg/dL (ref >39)
LDL Chol Calc (NIH): 134 mg/dL — ABNORMAL HIGH (ref 0–99)
Triglycerides: 81 mg/dL (ref 0–149)
VLDL Cholesterol Cal: 14 mg/dL (ref 5–40)

## 2020-06-22 NOTE — Telephone Encounter (Signed)
Left a message to return my call.

## 2020-06-22 NOTE — Telephone Encounter (Signed)
-----   Message from Park Liter, MD sent at 06/22/2020  9:12 AM EST ----- Cholesterol mildly elevated, I recommend diet and exercise for now

## 2020-06-28 ENCOUNTER — Telehealth: Payer: Self-pay

## 2020-06-28 NOTE — Telephone Encounter (Signed)
Left a message to return my call.

## 2020-06-28 NOTE — Telephone Encounter (Signed)
-----   Message from Robert J Krasowski, MD sent at 06/22/2020  9:12 AM EST ----- Cholesterol mildly elevated, I recommend diet and exercise for now 

## 2020-08-08 ENCOUNTER — Ambulatory Visit: Payer: Medicare Other | Attending: Family Medicine | Admitting: Family Medicine

## 2020-08-08 ENCOUNTER — Other Ambulatory Visit: Payer: Self-pay

## 2020-08-08 ENCOUNTER — Encounter: Payer: Self-pay | Admitting: Family Medicine

## 2020-08-08 VITALS — BP 161/81 | HR 55 | Ht 69.0 in | Wt 152.2 lb

## 2020-08-08 DIAGNOSIS — Z720 Tobacco use: Secondary | ICD-10-CM

## 2020-08-08 DIAGNOSIS — G8929 Other chronic pain: Secondary | ICD-10-CM | POA: Insufficient documentation

## 2020-08-08 DIAGNOSIS — Z713 Dietary counseling and surveillance: Secondary | ICD-10-CM | POA: Diagnosis not present

## 2020-08-08 DIAGNOSIS — Z7182 Exercise counseling: Secondary | ICD-10-CM | POA: Diagnosis not present

## 2020-08-08 DIAGNOSIS — Z79899 Other long term (current) drug therapy: Secondary | ICD-10-CM | POA: Diagnosis not present

## 2020-08-08 DIAGNOSIS — M545 Low back pain, unspecified: Secondary | ICD-10-CM | POA: Diagnosis not present

## 2020-08-08 DIAGNOSIS — I10 Essential (primary) hypertension: Secondary | ICD-10-CM

## 2020-08-08 DIAGNOSIS — Z1211 Encounter for screening for malignant neoplasm of colon: Secondary | ICD-10-CM | POA: Diagnosis not present

## 2020-08-08 DIAGNOSIS — F17210 Nicotine dependence, cigarettes, uncomplicated: Secondary | ICD-10-CM | POA: Diagnosis not present

## 2020-08-08 MED ORDER — LOSARTAN POTASSIUM-HCTZ 100-25 MG PO TABS: 1.0000 | ORAL_TABLET | Freq: Every day | ORAL | 1 refills | Status: DC

## 2020-08-08 MED ORDER — AMLODIPINE BESYLATE 10 MG PO TABS: 10.0000 mg | ORAL_TABLET | Freq: Every day | ORAL | 1 refills | Status: DC

## 2020-08-08 NOTE — Progress Notes (Signed)
Subjective:  Patient ID: Isaac Chan, male    DOB: 1955-12-28  Age: 65 y.o. MRN: [id number]  CC: New Patient (Initial Visit)   HPI Isaac Chan is a 65 year old male with a h/o Hypertension, tobacco abuse who presents to establish care. Endorses compliance with his antihypertensive but his blood pressure is elevated. Last seen by Cardiology on 06/21/20.  He has had chronic low back pain after prolonged standing intermittently for years. No h/o preceding trauma. Pain is nagging and not bothersome. 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 He has no additional concerns today.  Past Medical History:  Diagnosis Date  . Bradycardia 05/23/2020  . Dizziness 05/23/2020  . Dyspnea on exertion 05/23/2020  . Essential hypertension 05/23/2020  . Shoulder injury, left, initial encounter 07/02/2017  . Smoking 05/23/2020    Past Surgical History:  Procedure Laterality Date  . SHOULDER ARTHROSCOPY Left     No family history on file.  No Known Allergies  Outpatient Medications Prior to Visit  Medication Sig Dispense Refill  . amLODipine (NORVASC) 10 MG tablet Take 1 tablet (10 mg total) by mouth daily. 90 tablet 1  . hydrochlorothiazide (HYDRODIURIL) 25 MG tablet Take 1 tablet (25 mg total) by mouth daily. 90 tablet 0   No facility-administered medications prior to visit.     ROS Review of Systems  Constitutional: Negative for activity change and appetite change.  HENT: Negative for sinus pressure and sore throat.   Eyes: Negative for visual disturbance.  Respiratory: Negative for cough, chest tightness and shortness of breath.   Cardiovascular: Negative for chest pain and leg swelling.  Gastrointestinal: Negative for abdominal distention, abdominal pain, constipation and diarrhea.  Endocrine: Negative.   Genitourinary: Negative for dysuria.  Musculoskeletal: Positive for back pain. Negative for joint swelling and  myalgias.  Skin: Negative for rash.  Allergic/Immunologic: Negative.   Neurological: Negative for weakness, light-headedness and numbness.  Psychiatric/Behavioral: Negative for dysphoric mood and suicidal ideas.    Objective:  BP (!) 161/81   Pulse (!) 55   Ht 5\' 9"  (1.753 m)   Wt 152 lb 3.2 oz (69 kg)   SpO2 100%   BMI 22.48 kg/m   BP/Weight 08/08/2020 06/21/2020 63/33/5456  Systolic BP 256 389 373  Diastolic BP 81 90 428  Wt. (Lbs) 152.2 151 163  BMI 22.48 22.3 24.07      Physical Exam Constitutional:      Appearance: He is well-developed.  Neck:     Vascular: No JVD.  Cardiovascular:     Rate and Rhythm: Normal rate.     Heart sounds: Normal heart sounds. No murmur heard.   Pulmonary:     Effort: Pulmonary effort is normal.     Breath sounds: Normal breath sounds. No wheezing or rales.  Chest:     Chest wall: No tenderness.  Abdominal:     General: Bowel sounds are normal. There is no distension.     Palpations: Abdomen is soft. There is no mass.     Tenderness: There is no abdominal tenderness.  Musculoskeletal:        General: Normal range of motion.     Right lower leg: No edema.     Left lower leg: No edema.     Comments: Negative straight leg raise b/l.  Neurological:     Mental Status: He is alert and oriented to person, place, and time.  Psychiatric:        Mood and Affect: Mood normal.     CMP Latest Ref Rng & Units 06/21/2020 05/23/2020 05/17/2020  Glucose 65 - 99 mg/dL 94 101(H) 115(H)  BUN 8 - 27 mg/dL 19 15 18   Creatinine 0.76 - 1.27 mg/dL 0.91 1.15 1.17  Sodium 134 - 144 mmol/L 136 140 135  Potassium 3.5 - 5.2 mmol/L 4.0 4.0 4.1  Chloride 96 - 106 mmol/L 100 101 100  CO2 20 - 29 mmol/L 20 24 24   Calcium 8.6 - 10.2 mg/dL 9.4 9.6 9.3    Lipid Panel     Component Value Date/Time   CHOL 232 (H) 06/21/2020 0850   TRIG 81 06/21/2020 0850   HDL 84 06/21/2020 0850   CHOLHDL 2.8 06/21/2020 0850   LDLCALC 134 (H) 06/21/2020 0850    CBC     Component Value Date/Time   WBC 5.0 05/17/2020 0806   RBC 5.88 (H) 05/17/2020 0806   HGB 16.2 05/17/2020 0806   HCT 44.9 05/17/2020 0806   PLT 211 05/17/2020 0806   MCV 76.4 (L) 05/17/2020 0806   MCH 27.6 05/17/2020 0806   MCHC 36.1 (H) 05/17/2020 0806   RDW 13.8 05/17/2020 0806   LYMPHSABS 1.9 05/17/2020 0806   MONOABS 0.5 05/17/2020 0806   EOSABS 0.1 05/17/2020 0806   BASOSABS 0.0 05/17/2020 0806    No results found for: HGBA1C  Assessment & Plan:    1. Essential hypertension Uncontrolled Switched from HCTZ to Losartan/HCTZ He will see the Clinical Pharmacist to reassess his BP and ensure he is tolerating medication oka yand not hypotensive Counseled on blood pressure goal of less than 130/80, low-sodium, DASH diet, medication compliance, 150 minutes of moderate intensity exercise per week. Discussed medication compliance, adverse effects. - losartan-hydrochlorothiazide (HYZAAR) 100-25 MG tablet; Take 1 tablet by mouth daily.  Dispense: 90 tablet; Refill: 1 - amLODipine (NORVASC) 10 MG tablet; Take 1 tablet (10 mg total) by mouth daily.  Dispense: 90 tablet; Refill: 1  2. Chronic midline low back pain without sciatica Pain is minimal and patient is able to tolerate this with OTC analgesics  3. Screening for colon cancer - Ambulatory referral to Gastroenterology  4. Tobacco abuse Counseled on cessation and he is not ready to quit.   Return in about 1 week (around 08/15/2020) for BP evaluation with Isaac Chan; 3 months with PCP.      Isaac Rakes, MD, FAAFP. [facility] [place], [place]   08/08/2020, 9:49 AM

## 2020-08-08 NOTE — Progress Notes (Signed)
Back pain at times.

## 2020-08-08 NOTE — Patient Instructions (Signed)
Steps to Quit Smoking Smoking tobacco is the leading cause of preventable death. It can affect almost every organ in the body. Smoking puts you and people around you at risk for many serious, long-lasting (chronic) diseases. Quitting smoking can be hard, but it is one of the best things that you can do for your health. It is never too late to quit. How do I get ready to quit? When you decide to quit smoking, make a plan to help you succeed. Before you quit:  Pick a date to quit. Set a date within the next 2 weeks to give you time to prepare.  Write down the reasons why you are quitting. Keep this list in places where you will see it often.  Tell your family, friends, and co-workers that you are quitting. Their support is important.  Talk with your doctor about the choices that may help you quit.  Find out if your health insurance will pay for these treatments.  Know the people, places, things, and activities that make you want to smoke (triggers). Avoid them. What first steps can I take to quit smoking?  Throw away all cigarettes at home, at work, and in your car.  Throw away the things that you use when you smoke, such as ashtrays and lighters.  Clean your car. Make sure to empty the ashtray.  Clean your home, including curtains and carpets. What can I do to help me quit smoking? Talk with your doctor about taking medicines and seeing a counselor at the same time. You are more likely to succeed when you do both.  If you are pregnant or breastfeeding, talk with your doctor about counseling or other ways to quit smoking. Do not take medicine to help you quit smoking unless your doctor tells you to do so. To quit smoking: Quit right away  Quit smoking totally, instead of slowly cutting back on how much you smoke over a period of time.  Go to counseling. You are more likely to quit if you go to counseling sessions regularly. Take medicine You may take medicines to help you quit. Some  medicines need a prescription, and some you can buy over-the-counter. Some medicines may contain a drug called nicotine to replace the nicotine in cigarettes. Medicines may:  Help you to stop having the desire to smoke (cravings).  Help to stop the problems that come when you stop smoking (withdrawal symptoms). Your doctor may ask you to use:  Nicotine patches, gum, or lozenges.  Nicotine inhalers or sprays.  Non-nicotine medicine that is taken by mouth. Find resources Find resources and other ways to help you quit smoking and remain smoke-free after you quit. These resources are most helpful when you use them often. They include:  Online chats with a counselor.  Phone quitlines.  Printed self-help materials.  Support groups or group counseling.  Text messaging programs.  Mobile phone apps. Use apps on your mobile phone or tablet that can help you stick to your quit plan. There are many free apps for mobile phones and tablets as well as websites. Examples include Quit Guide from the CDC and smokefree.gov   What things can I do to make it easier to quit?  Talk to your family and friends. Ask them to support and encourage you.  Call a phone quitline (1-800-QUIT-NOW), reach out to support groups, or work with a counselor.  Ask people who smoke to not smoke around you.  Avoid places that make you want to smoke,   such as: ? Bars. ? Parties. ? Smoke-break areas at work.  Spend time with people who do not smoke.  Lower the stress in your life. Stress can make you want to smoke. Try these things to help your stress: ? Getting regular exercise. ? Doing deep-breathing exercises. ? Doing yoga. ? Meditating. ? Doing a body scan. To do this, close your eyes, focus on one area of your body at a time from head to toe. Notice which parts of your body are tense. Try to relax the muscles in those areas.   How will I feel when I quit smoking? Day 1 to 3 weeks Within the first 24 hours,  you may start to have some problems that come from quitting tobacco. These problems are very bad 2-3 days after you quit, but they do not often last for more than 2-3 weeks. You may get these symptoms:  Mood swings.  Feeling restless, nervous, angry, or annoyed.  Trouble concentrating.  Dizziness.  Strong desire for high-sugar foods and nicotine.  Weight gain.  Trouble pooping (constipation).  Feeling like you may vomit (nausea).  Coughing or a sore throat.  Changes in how the medicines that you take for other issues work in your body.  Depression.  Trouble sleeping (insomnia). Week 3 and afterward After the first 2-3 weeks of quitting, you may start to notice more positive results, such as:  Better sense of smell and taste.  Less coughing and sore throat.  Slower heart rate.  Lower blood pressure.  Clearer skin.  Better breathing.  Fewer sick days. Quitting smoking can be hard. Do not give up if you fail the first time. Some people need to try a few times before they succeed. Do your best to stick to your quit plan, and talk with your doctor if you have any questions or concerns. Summary  Smoking tobacco is the leading cause of preventable death. Quitting smoking can be hard, but it is one of the best things that you can do for your health.  When you decide to quit smoking, make a plan to help you succeed.  Quit smoking right away, not slowly over a period of time.  When you start quitting, seek help from your doctor, family, or friends. This information is not intended to replace advice given to you by your health care provider. Make sure you discuss any questions you have with your health care provider. Document Revised: 02/18/2019 Document Reviewed: 08/14/2018 Elsevier Patient Education  2021 Elsevier Inc.  

## 2020-08-20 ENCOUNTER — Ambulatory Visit: Payer: Medicare Other | Attending: Family Medicine | Admitting: Pharmacist

## 2020-08-20 ENCOUNTER — Other Ambulatory Visit: Payer: Self-pay

## 2020-08-20 ENCOUNTER — Encounter: Payer: Self-pay | Admitting: Pharmacist

## 2020-08-20 VITALS — BP 123/77 | HR 49

## 2020-08-20 DIAGNOSIS — I10 Essential (primary) hypertension: Secondary | ICD-10-CM

## 2020-08-20 NOTE — Progress Notes (Signed)
   S:   PCP: Dr. Margarita Rana  Patient arrives in good spirits. Presents to the clinic for hypertension evaluation, counseling, and management. Patient was referred and last seen by Primary Care Provider on 08/08/2020. BP was elevated at that last visit. Dr. Margarita Rana changed HCTZ to combination Hyzaar.    Today, pt denies chest pain, dyspnea, HA or blurred vision. Denies dizziness or presyncope. No lightheadedness.   Medication adherence reported.  Current BP Medications include:  Amlodipine 10 mg daily, Losartan-HCTZ 100-25 mg daily   Dietary habits include: compliant with salt restriction;drinks coffee in the mornings  Exercise habits include: no formal exercise regimen - works 3 days a week and gets most of his physical activity during work Family / Social history:  - FHx: no pertinent positives - Tobacco: current every day smoker  - Alcohol: drinks "a couple of beers almost every day"   O:  Vitals:   08/20/20 1438  BP: 123/77  Pulse: (!) 49    Home BP readings:  - No log or cuff with him  - Tells me his wife takes it at home and it has been running ~130s/80s  Last 3 Office BP readings: BP Readings from Last 3 Encounters:  08/20/20 123/77  08/08/20 (!) 161/81  06/21/20 (!) 142/90   BMET    Component Value Date/Time   NA 136 06/21/2020 0850   K 4.0 06/21/2020 0850   CL 100 06/21/2020 0850   CO2 20 06/21/2020 0850   GLUCOSE 94 06/21/2020 0850   GLUCOSE 115 (H) 05/17/2020 0806   BUN 19 06/21/2020 0850   CREATININE 0.91 06/21/2020 0850   CALCIUM 9.4 06/21/2020 0850   GFRNONAA 89 06/21/2020 0850   GFRNONAA >60 05/17/2020 0806   GFRAA 103 06/21/2020 0850    Renal function: CrCl cannot be calculated (Patient's most recent lab result is older than the maximum 21 days allowed.).  Clinical ASCVD: No  The 10-year ASCVD risk score Mikey Bussing DC Jr., et al., 2013) is: 21.5%   Values used to calculate the score:     Age: 65 years     Sex: Male     Is Non-Hispanic African American:  Yes     Diabetic: No     Tobacco smoker: Yes     Systolic Blood Pressure: 881 mmHg     Is BP treated: Yes     HDL Cholesterol: 84 mg/dL     Total Cholesterol: 232 mg/dL  A/P: Hypertension longstanding currently at goal on current medications. BP Goal = < 130/80 mmHg. Medication adherence reported.  -Continued current regimen.  -Counseled on lifestyle modifications for blood pressure control including reduced dietary sodium, increased exercise, adequate sleep.  Results reviewed and written information provided.   Total time in face-to-face counseling 15 minutes.   F/U Clinic Visit in 3 weeks.    Benard Halsted, PharmD, [place], [place] [phone]

## 2020-09-10 ENCOUNTER — Other Ambulatory Visit: Payer: Self-pay

## 2020-09-10 ENCOUNTER — Encounter: Payer: Self-pay | Admitting: Pharmacist

## 2020-09-10 ENCOUNTER — Ambulatory Visit: Payer: Medicare Other | Attending: Family Medicine | Admitting: Pharmacist

## 2020-09-10 VITALS — BP 154/88

## 2020-09-10 DIAGNOSIS — I10 Essential (primary) hypertension: Secondary | ICD-10-CM

## 2020-09-10 NOTE — Progress Notes (Signed)
   S:   PCP: Dr. Margarita Rana  Patient arrives in good spirits. Presents to the clinic for hypertension evaluation, counseling, and management. Patient was referred and last seen by Primary Care Provider on 08/08/2020. I saw him on 08/20/2020 and his BP looked good.   Today, pt denies chest pain, dyspnea, HA or blurred vision. Denies dizziness or presyncope. No lightheadedness.   Medication adherence reported, however, he tells me he is taking amlodipine and HCTZ. He has not been taking his Hyzaar.   Current BP Medications include:  Amlodipine 10 mg daily, Losartan-HCTZ 100-25 mg daily (taking HCTZ only)  Dietary habits include: compliant with salt restriction; drinks coffee in the mornings  Exercise habits include: no formal exercise regimen - works 3 days a week and gets most of his physical activity during work Family / Social history:  - FHx: no pertinent positives - Tobacco: current every day smoker  - Alcohol: drinks "a couple of beers almost every day"   O:  Vitals:   09/10/20 1051  BP: (!) 154/88    Home BP readings:  - No log or cuff with him  - Tells me his wife takes it at home and it has been running ~130s/70s    Last 3 Office BP readings: BP Readings from Last 3 Encounters:  09/10/20 (!) 154/88  08/20/20 123/77  08/08/20 (!) 161/81   BMET    Component Value Date/Time   NA 136 06/21/2020 0850   K 4.0 06/21/2020 0850   CL 100 06/21/2020 0850   CO2 20 06/21/2020 0850   GLUCOSE 94 06/21/2020 0850   GLUCOSE 115 (H) 05/17/2020 0806   BUN 19 06/21/2020 0850   CREATININE 0.91 06/21/2020 0850   CALCIUM 9.4 06/21/2020 0850   GFRNONAA 89 06/21/2020 0850   GFRNONAA >60 05/17/2020 0806   GFRAA 103 06/21/2020 0850    Renal function: CrCl cannot be calculated (Patient's most recent lab result is older than the maximum 21 days allowed.).  Clinical ASCVD: No  The 10-year ASCVD risk score Mikey Bussing DC Jr., et al., 2013) is: 32%   Values used to calculate the score:     Age:  35 years     Sex: Male     Is Non-Hispanic African American: Yes     Diabetic: No     Tobacco smoker: Yes     Systolic Blood Pressure: 625 mmHg     Is BP treated: Yes     HDL Cholesterol: 84 mg/dL     Total Cholesterol: 232 mg/dL  A/P: Hypertension longstanding currently above goal on current medications. BP Goal = < 130/80 mmHg. I have instructed him to dispose of the HCTZ and take the Hyzaar as prescribed. He has been instructed to take this along with his amlodipine daily.   -Continued current regimen.  -Pt instructed to stop HCTZ.  -Pt instructed to take Hyzaar and amlodipine daily.  -Counseled on lifestyle modifications for blood pressure control including reduced dietary sodium, increased exercise, adequate sleep.  Results reviewed and written information provided.   Total time in face-to-face counseling 15 minutes.   F/U Clinic Visit in 1 month.    Benard Halsted, PharmD, [facility] [place], [place] [phone]

## 2020-09-11 ENCOUNTER — Other Ambulatory Visit: Payer: Self-pay

## 2020-09-14 ENCOUNTER — Encounter: Payer: Self-pay | Admitting: Cardiology

## 2020-09-14 ENCOUNTER — Ambulatory Visit (INDEPENDENT_AMBULATORY_CARE_PROVIDER_SITE_OTHER): Payer: Medicare Other | Admitting: Cardiology

## 2020-09-14 ENCOUNTER — Other Ambulatory Visit: Payer: Self-pay

## 2020-09-14 ENCOUNTER — Other Ambulatory Visit: Payer: Self-pay | Admitting: Cardiology

## 2020-09-14 VITALS — BP 146/92 | HR 91 | Ht 69.0 in | Wt 149.0 lb

## 2020-09-14 DIAGNOSIS — I10 Essential (primary) hypertension: Secondary | ICD-10-CM

## 2020-09-14 DIAGNOSIS — F17200 Nicotine dependence, unspecified, uncomplicated: Secondary | ICD-10-CM

## 2020-09-14 DIAGNOSIS — R0600 Dyspnea, unspecified: Secondary | ICD-10-CM | POA: Diagnosis not present

## 2020-09-14 DIAGNOSIS — R001 Bradycardia, unspecified: Secondary | ICD-10-CM | POA: Diagnosis not present

## 2020-09-14 DIAGNOSIS — R0609 Other forms of dyspnea: Secondary | ICD-10-CM

## 2020-09-14 NOTE — Patient Instructions (Signed)
Medication Instructions:  Your physician recommends that you continue on your current medications as directed. Please refer to the Current Medication list given to you today.  *If you need a refill on your cardiac medications before your next appointment, please call your pharmacy*   Lab Work: Your physician recommends that you return for lab work in: [facility] If you have labs (blood work) drawn today and your tests are completely normal, you will receive your results only by: Marland Kitchen MyChart Message (if you have MyChart) OR . A paper copy in the mail If you have any lab test that is abnormal or we need to change your treatment, we will call you to review the results.   Testing/Procedures: None   Follow-Up: At [facility], you and your health needs are our priority.  As part of our continuing mission to provide you with exceptional heart care, we have created designated Provider Care Teams.  These Care Teams include your primary Cardiologist (physician) and Advanced Practice Providers (APPs -  Physician Assistants and Nurse Practitioners) who all work together to provide you with the care you need, when you need it.  We recommend signing up for the patient portal called "MyChart".  Sign up information is provided on this After Visit Summary.  MyChart is used to connect with patients for Virtual Visits (Telemedicine).  Patients are able to view lab/test results, encounter notes, upcoming appointments, etc.  Non-urgent messages can be sent to your provider as well.   To learn more about what you can do with MyChart, go to [email or web address].    Your next appointment:   6 month(s)  The format for your next appointment:   In Person  Provider:   Jenne Campus, MD   Other Instructions

## 2020-09-14 NOTE — Addendum Note (Signed)
Addended by: Darrel Reach on: 09/14/2020 08:31 AM   Modules accepted: Orders

## 2020-09-14 NOTE — Progress Notes (Signed)
Cardiology Office Note:    Date:  09/14/2020   ID:  Isaac Chan, DOB [birth year], MRN [id number]  PCP:  Charlott Rakes, MD  Cardiologist:  Jenne Campus, MD    Referring MD: No ref. provider found   Chief Complaint  Patient presents with  . Follow-up  I am doing well  History of Present Illness:    Isaac Chan is a 65 y.o. male with past medical history significant for essential hypertension which seems to be somewhat difficult to control, bradycardia, dyspnea on exertion, smoking.  He comes today to my office for follow-up.  Overall he is doing well denies have any cardiac complaints, there is no chest pain tightness squeezing pressure burning chest no swelling of lower extremities.  His medication has been adjusted his blood pressure seems to be better controlled.  He said when he check his blood pressure at home he usually see numbers between 742/595 systolic however today in the office 146/92.  Still not perfectly controlled but to be getting very close.  Past Medical History:  Diagnosis Date  . Bradycardia 05/23/2020  . Dizziness 05/23/2020  . Dyspnea on exertion 05/23/2020  . Essential hypertension 05/23/2020  . Shoulder injury, left, initial encounter 07/02/2017  . Smoking 05/23/2020    Past Surgical History:  Procedure Laterality Date  . R wrist fx repaired    . SHOULDER ARTHROSCOPY Left     Current Medications: Current Meds  Medication Sig  . amLODipine (NORVASC) 10 MG tablet Take 1 tablet (10 mg total) by mouth daily.  Marland Kitchen losartan-hydrochlorothiazide (HYZAAR) 100-25 MG tablet Take 1 tablet by mouth daily.     Allergies:   Patient has no known allergies.   Social History   Socioeconomic History  . Marital status: Married    Spouse name: Not on file  . Number of children: Not on file  . Years of education: Not on file  . Highest education level: Not on file  Occupational History  . Not on file  Tobacco Use  . Smoking status: Current Every Day Smoker   . Smokeless tobacco: Never Used  Substance and Sexual Activity  . Alcohol use: Yes  . Drug use: Not Currently  . Sexual activity: Not on file  Other Topics Concern  . Not on file  Social History Narrative  . Not on file   Social Determinants of Health   Financial Resource Strain: Not on file  Food Insecurity: Not on file  Transportation Needs: Not on file  Physical Activity: Not on file  Stress: Not on file  Social Connections: Not on file     Family History: The patient's family history is not on file. ROS:   Please see the history of present illness.    All 14 point review of systems negative except as described per history of present illness  EKGs/Labs/Other Studies Reviewed:      Recent Labs: 05/17/2020: Hemoglobin 16.2; Platelets 211 06/21/2020: BUN 19; Creatinine, Ser 0.91; Potassium 4.0; Sodium 136  Recent Lipid Panel    Component Value Date/Time   CHOL 232 (H) 06/21/2020 0850   TRIG 81 06/21/2020 0850   HDL 84 06/21/2020 0850   CHOLHDL 2.8 06/21/2020 0850   LDLCALC 134 (H) 06/21/2020 0850    Physical Exam:    VS:  BP (!) 146/92 (BP Location: Right Arm, Patient Position: Sitting)   Pulse 91   Ht 5\' 9"  (1.753 m)   Wt 149 lb (67.6 kg)   SpO2 100%   BMI 22.00  kg/m     Wt Readings from Last 3 Encounters:  09/14/20 149 lb (67.6 kg)  08/08/20 152 lb 3.2 oz (69 kg)  06/21/20 151 lb (68.5 kg)     GEN:  Well nourished, well developed in no acute distress HEENT: Normal NECK: No JVD; No carotid bruits LYMPHATICS: No lymphadenopathy CARDIAC: RRR, no murmurs, no rubs, no gallops RESPIRATORY:  Clear to auscultation without rales, wheezing or rhonchi  ABDOMEN: Soft, non-tender, non-distended MUSCULOSKELETAL:  No edema; No deformity  SKIN: Warm and dry LOWER EXTREMITIES: no swelling NEUROLOGIC:  Alert and oriented x 3 PSYCHIATRIC:  Normal affect   ASSESSMENT:    1. Essential hypertension   2. Dyspnea on exertion   3. Bradycardia   4. Smoking     PLAN:    In order of problems listed above:  1. Essential hypertension better but still not at target, will check his Chem-7 today and decide about potentially adding some extra medication he may benefit from Aldactone.  But I have to check make sure his potassium and kidney function is stable. 2. Dyspnea on exertion denies having any, echocardiogram reviewed looks good. 3. Bradycardia but asymptomatic, no syncope no dizziness no palpitations. 4. Smoking again spent about 5 minutes talking about this is strongly recommended him to quit and I told him clearly that smoking doubles his chances for having coronary event in the future. 5. Dyslipidemia.  Continue statin cholesterol with LDL of 134 and HDL of 84.  Will wait for until his blood pressure will be stabilized and at that time we will recalculate his risk factors and decide about potential therapy.   Medication Adjustments/Labs and Tests Ordered: Current medicines are reviewed at length with the patient today.  Concerns regarding medicines are outlined above.  No orders of the defined types were placed in this encounter.  Medication changes: No orders of the defined types were placed in this encounter.   Signed, Park Liter, MD, [facility] 09/14/2020 8:26 AM    [place]

## 2020-09-14 NOTE — Telephone Encounter (Signed)
Pharmacy notified refills to soon

## 2020-09-15 LAB — BASIC METABOLIC PANEL
BUN/Creatinine Ratio: 22 (ref 10–24)
BUN: 22 mg/dL (ref 8–27)
CO2: 20 mmol/L (ref 20–29)
Calcium: 9.4 mg/dL (ref 8.6–10.2)
Chloride: 98 mmol/L (ref 96–106)
Creatinine, Ser: 1.01 mg/dL (ref 0.76–1.27)
Glucose: 102 mg/dL — ABNORMAL HIGH (ref 65–99)
Potassium: 4.3 mmol/L (ref 3.5–5.2)
Sodium: 137 mmol/L (ref 134–144)
eGFR: 83 mL/min/{1.73_m2} (ref >59)

## 2020-09-24 ENCOUNTER — Telehealth: Payer: Self-pay | Admitting: Emergency Medicine

## 2020-09-24 ENCOUNTER — Telehealth: Payer: Self-pay | Admitting: Cardiology

## 2020-09-24 DIAGNOSIS — Z79899 Other long term (current) drug therapy: Secondary | ICD-10-CM

## 2020-09-24 MED ORDER — SPIRONOLACTONE 25 MG PO TABS: 25.0000 mg | ORAL_TABLET | Freq: Every day | ORAL | 1 refills | Status: DC

## 2020-09-24 NOTE — Telephone Encounter (Signed)
Patient informed of results.  

## 2020-09-24 NOTE — Telephone Encounter (Signed)
Patient informed of results he will start aldactone 25 mg daily and repeat labs in 1 week. No further questions.

## 2020-09-24 NOTE — Telephone Encounter (Signed)
Patient was calling to see if Dr. Raliegh Ip had looked at his lab results yet. Please call back

## 2020-09-24 NOTE — Telephone Encounter (Signed)
-----   Message from Park Liter, MD sent at 09/19/2020  1:10 PM EDT ----- Chem-7 looks good, please add Aldactone 25 mg daily, Chem-7 need to be repeated in 1 week

## 2020-10-09 LAB — BASIC METABOLIC PANEL
BUN/Creatinine Ratio: 24 (ref 10–24)
BUN: 26 mg/dL (ref 8–27)
CO2: 20 mmol/L (ref 20–29)
Calcium: 9.2 mg/dL (ref 8.6–10.2)
Chloride: 96 mmol/L (ref 96–106)
Creatinine, Ser: 1.09 mg/dL (ref 0.76–1.27)
Glucose: 111 mg/dL — ABNORMAL HIGH (ref 65–99)
Potassium: 4.4 mmol/L (ref 3.5–5.2)
Sodium: 131 mmol/L — ABNORMAL LOW (ref 134–144)
eGFR: 75 mL/min/{1.73_m2} (ref >59)

## 2020-10-12 ENCOUNTER — Telehealth: Payer: Self-pay

## 2020-10-12 ENCOUNTER — Ambulatory Visit: Payer: Medicare Other | Attending: Family Medicine | Admitting: Pharmacist

## 2020-10-12 ENCOUNTER — Encounter: Payer: Self-pay | Admitting: Pharmacist

## 2020-10-12 ENCOUNTER — Other Ambulatory Visit: Payer: Self-pay

## 2020-10-12 VITALS — BP 126/74 | HR 53

## 2020-10-12 DIAGNOSIS — I10 Essential (primary) hypertension: Secondary | ICD-10-CM | POA: Diagnosis not present

## 2020-10-12 NOTE — Telephone Encounter (Signed)
-----   Message from Park Liter, MD sent at 10/11/2020 11:22 AM EDT ----- Cholesterol good, continue present management

## 2020-10-12 NOTE — Progress Notes (Signed)
   S:   PCP: Dr. Margarita Rana  Patient arrives in good spirits. Presents to the clinic for hypertension evaluation, counseling, and management. Patient was referred and last seen by Primary Care Provider on 08/08/2020. I saw him on 4/47/2022 and his BP was elevated. We provided educations and advised pt to take combination Hyzaar as prescribed (he was still taking HCTZ only). Since that visit, he was seen by Cardiology 09/14/2020 and spironolactone was added. His most recent BMP was wnl.   Today, pt denies chest pain, dyspnea, HA or blurred vision. Denies dizziness or presyncope. No lightheadedness.   Medication adherence reported. He has taken them this morning.   Current BP Medications include:  Amlodipine 10 mg daily, Losartan-HCTZ 100-25 mg daily, spironolactone 25 mg daily   Dietary habits include: compliant with salt restriction; drinks coffee in the mornings  Exercise habits include: no formal exercise regimen - works 3 days a week and gets most of his physical activity during work Family / Social history:  - FHx: no pertinent positives - Tobacco: current every day smoker  - Alcohol: drinks "a couple of beers almost every day"   O:  Vitals:   10/12/20 0848  BP: 126/74  Pulse: (!) 53   Home BP readings:  - No log or cuff with him  -Tells me his wife takes it at home and it has been running ~110s/70s    Last 3 Office BP readings: BP Readings from Last 3 Encounters:  10/12/20 126/74  09/14/20 (!) 146/92  09/10/20 (!) 154/88   BMET    Component Value Date/Time   NA 131 (L) 10/08/2020 0905   K 4.4 10/08/2020 0905   CL 96 10/08/2020 0905   CO2 20 10/08/2020 0905   GLUCOSE 111 (H) 10/08/2020 0905   GLUCOSE 115 (H) 05/17/2020 0806   BUN 26 10/08/2020 0905   CREATININE 1.09 10/08/2020 0905   CALCIUM 9.2 10/08/2020 0905   GFRNONAA 89 06/21/2020 0850   GFRNONAA >60 05/17/2020 0806   GFRAA 103 06/21/2020 0850    Renal function: CrCl cannot be calculated (Unknown ideal  weight.).  Clinical ASCVD: No  The 10-year ASCVD risk score Mikey Bussing DC Jr., et al., 2013) is: 23.1%   Values used to calculate the score:     Age: 65 years     Sex: Male     Is Non-Hispanic African American: Yes     Diabetic: No     Tobacco smoker: Yes     Systolic Blood Pressure: 768 mmHg     Is BP treated: Yes     HDL Cholesterol: 84 mg/dL     Total Cholesterol: 232 mg/dL  A/P: Hypertension longstanding currently at goal on current medications. BP Goal = < 130/80 mmHg. Medication adherence reported.  -Continued current regimen.  -Counseled on lifestyle modifications for blood pressure control including reduced dietary sodium, increased exercise, adequate sleep.  Results reviewed and written information provided.   Total time in face-to-face counseling 15 minutes.   F/U Clinic Visit with PCP next month.   Benard Halsted, PharmD, [facility], [place] [phone]

## 2020-10-12 NOTE — Telephone Encounter (Signed)
Patient notified of results.

## 2020-11-12 ENCOUNTER — Ambulatory Visit: Payer: Medicare Other | Attending: Family Medicine | Admitting: Family Medicine

## 2020-11-12 ENCOUNTER — Encounter: Payer: Self-pay | Admitting: Family Medicine

## 2020-11-12 ENCOUNTER — Other Ambulatory Visit: Payer: Self-pay

## 2020-11-12 VITALS — BP 139/68 | HR 50 | Ht 69.0 in | Wt 145.4 lb

## 2020-11-12 DIAGNOSIS — E785 Hyperlipidemia, unspecified: Secondary | ICD-10-CM | POA: Insufficient documentation

## 2020-11-12 DIAGNOSIS — Z79899 Other long term (current) drug therapy: Secondary | ICD-10-CM | POA: Diagnosis not present

## 2020-11-12 DIAGNOSIS — E7800 Pure hypercholesterolemia, unspecified: Secondary | ICD-10-CM

## 2020-11-12 DIAGNOSIS — I10 Essential (primary) hypertension: Secondary | ICD-10-CM | POA: Diagnosis not present

## 2020-11-12 DIAGNOSIS — R001 Bradycardia, unspecified: Secondary | ICD-10-CM | POA: Diagnosis not present

## 2020-11-12 MED ORDER — ATORVASTATIN CALCIUM 20 MG PO TABS: 20.0000 mg | ORAL_TABLET | Freq: Every day | ORAL | 6 refills | Status: DC

## 2020-11-12 NOTE — Patient Instructions (Signed)
Preventing High Cholesterol Cholesterol is a white, waxy substance similar to fat that the human body needs to help build cells. The liver makes all the cholesterol that a person's body needs. Having high cholesterol (hypercholesterolemia) increases your risk for heart disease and stroke. Extra or excess cholesterol comes from the food that you eat. High cholesterol can often be prevented with diet and lifestyle changes. If you already have high cholesterol, you can control it with diet, lifestyle changes, and medicines. How can high cholesterol affect me? If you have high cholesterol, fatty deposits (plaques) may build up on the walls of your blood vessels. The blood vessels that carry blood away from your heart are called arteries. Plaques make the arteries narrower and stiffer. This in turn can:  Restrict or block blood flow and cause blood clots to form.  Increase your risk for heart attack and stroke. What can increase my risk for high cholesterol? This condition is more likely to develop in people who:  Eat foods that are high in saturated fat or cholesterol. Saturated fat is mostly found in foods that come from animal sources.  Are overweight.  Are not getting enough exercise.  Have a family history of high cholesterol (familial hypercholesterolemia). What actions can I take to prevent this? Nutrition  Eat less saturated fat.  Avoid trans fats (partially hydrogenated oils). These are often found in margarine and in some baked goods, fried foods, and snacks bought in packages.  Avoid precooked or cured meat, such as bacon, sausages, or meat loaves.  Avoid foods and drinks that have added sugars.  Eat more fruits, vegetables, and whole grains.  Choose healthy sources of protein, such as fish, poultry, lean cuts of red meat, beans, peas, lentils, and nuts.  Choose healthy sources of fat, such as: ? Nuts. ? Vegetable oils, especially olive oil. ? Fish that have healthy fats,  such as omega-3 fatty acids. These fish include mackerel or salmon.   Lifestyle  Lose weight if you are overweight. Maintaining a healthy body mass index (BMI) can help prevent or control high cholesterol. It can also lower your risk for diabetes and high blood pressure. Ask your health care provider to help you with a diet and exercise plan to lose weight safely.  Do not use any products that contain nicotine or tobacco, such as cigarettes, e-cigarettes, and chewing tobacco. If you need help quitting, ask your health care provider. Alcohol use  Do not drink alcohol if: ? Your health care provider tells you not to drink. ? You are pregnant, may be pregnant, or are planning to become pregnant.  If you drink alcohol: ? Limit how much you use to:  0-1 drink a day for women.  0-2 drinks a day for men. ? Be aware of how much alcohol is in your drink. In the [place] one drink equals one 12 oz bottle of beer (355 mL), one 5 oz glass of wine (148 mL), or one 1 oz glass of hard liquor (44 mL). Activity  Get enough exercise. Do exercises as told by your health care provider.  Each week, do at least 150 minutes of exercise that takes a medium level of effort (moderate-intensity exercise). This kind of exercise: ? Makes your heart beat faster while allowing you to still be able to talk. ? Can be done in short sessions several times a day or longer sessions a few times a week. For example, on 5 days each week, you could walk fast or ride   your bike 3 times a day for 10 minutes each time.   Medicines  Your health care provider may recommend medicines to help lower cholesterol. This may be a medicine to lower the amount of cholesterol that your liver makes. You may need medicine if: ? Diet and lifestyle changes have not lowered your cholesterol enough. ? You have high cholesterol and other risk factors for heart disease or stroke.  Take over-the-counter and prescription medicines only as told by your  health care provider. General information  Manage your risk factors for high cholesterol. Talk with your health care provider about all your risk factors and how to lower your risk.  Manage other conditions that you have, such as diabetes or high blood pressure (hypertension).  Have blood tests to check your cholesterol levels at regular points in time as told by your health care provider.  Keep all follow-up visits as told by your health care provider. This is important. Where to find more information  American Heart Association: www.heart.org  National Heart, Lung, and Blood Institute: www.nhlbi.nih.gov Summary  High cholesterol increases your risk for heart disease and stroke. By keeping your cholesterol level low, you can reduce your risk for these conditions.  High cholesterol can often be prevented with diet and lifestyle changes.  Work with your health care provider to manage your risk factors, and have your blood tested regularly. This information is not intended to replace advice given to you by your health care provider. Make sure you discuss any questions you have with your health care provider. Document Revised: 03/08/2019 Document Reviewed: 03/08/2019 Elsevier Patient Education  2021 Elsevier Inc.  

## 2020-11-12 NOTE — Progress Notes (Signed)
Subjective:  Patient ID: Isaac Chan, male    DOB: [birth year]  Age: 65 y.o. MRN: [id number]  CC: Hypertension   HPI Isaac Chan is a 65 year old male with a h/o Hypertension, hyperlipidemia, tobacco abuse here for chronic disease management.  Interval History: His last lipid panel was abnormal in 06/2020 (total cholesterol 232, LDL 134, HDL 84) and diet and exercise recommended per cardiologist-Dr. Agustin Cree.Marland Kitchen  He is bradycardic at 50 bpm today but was 91 at his last visit with Cardiology.  Denies presence of lightheadedness, chest pain or shortness of breath. Compliant with his antihypertensive. His back pain is stable and is exacerbated by prolonged standing; pain is absent at this time.  OTC regimens have been beneficial. Continues to smoke and is not ready to quit.  Past Medical History:  Diagnosis Date  . Bradycardia 05/23/2020  . Dizziness 05/23/2020  . Dyspnea on exertion 05/23/2020  . Essential hypertension 05/23/2020  . Shoulder injury, left, initial encounter 07/02/2017  . Smoking 05/23/2020    Past Surgical History:  Procedure Laterality Date  . R wrist fx repaired    . SHOULDER ARTHROSCOPY Left     History reviewed. No pertinent family history.  No Known Allergies  Outpatient Medications Prior to Visit  Medication Sig Dispense Refill  . losartan-hydrochlorothiazide (HYZAAR) 100-25 MG tablet Take 1 tablet by mouth daily. 90 tablet 1  . spironolactone (ALDACTONE) 25 MG tablet Take 1 tablet (25 mg total) by mouth daily. 90 tablet 1  . amLODipine (NORVASC) 10 MG tablet Take 1 tablet (10 mg total) by mouth daily. 90 tablet 1   No facility-administered medications prior to visit.     ROS Review of Systems  Constitutional: Negative for activity change and appetite change.  HENT: Negative for sinus pressure and sore throat.   Eyes: Negative for visual disturbance.  Respiratory: Negative for cough, chest tightness and shortness of breath.   Cardiovascular:  Negative for chest pain and leg swelling.  Gastrointestinal: Negative for abdominal distention, abdominal pain, constipation and diarrhea.  Endocrine: Negative.   Genitourinary: Negative for dysuria.  Musculoskeletal: Negative for joint swelling and myalgias.  Skin: Negative for rash.  Allergic/Immunologic: Negative.   Neurological: Negative for weakness, light-headedness and numbness.  Psychiatric/Behavioral: Negative for dysphoric mood and suicidal ideas.    Objective:  BP 139/68   Pulse (!) 50   Ht 5\' 9"  (1.753 m)   Wt 145 lb 6.4 oz (66 kg)   SpO2 99%   BMI 21.47 kg/m   BP/Weight 11/12/2020 08/13/1060 11/15/4852  Systolic BP 627 035 009  Diastolic BP 68 74 92  Wt. (Lbs) 145.4 - 149  BMI 21.47 - 22      Physical Exam Constitutional:      Appearance: He is well-developed.  Neck:     Vascular: No JVD.  Cardiovascular:     Rate and Rhythm: Bradycardia present.     Heart sounds: Normal heart sounds. No murmur heard.   Pulmonary:     Effort: Pulmonary effort is normal.     Breath sounds: Normal breath sounds. No wheezing or rales.  Chest:     Chest wall: No tenderness.  Abdominal:     General: Bowel sounds are normal. There is no distension.     Palpations: Abdomen is soft. There is no mass.     Tenderness: There is no abdominal tenderness.  Musculoskeletal:        General: Normal range of motion.     Right lower leg:  No edema.     Left lower leg: No edema.  Neurological:     Mental Status: He is alert and oriented to person, place, and time.  Psychiatric:        Mood and Affect: Mood normal.     CMP Latest Ref Rng & Units 10/08/2020 09/14/2020 06/21/2020  Glucose 65 - 99 mg/dL 111(H) 102(H) 94  BUN 8 - 27 mg/dL 26 22 19   Creatinine 0.76 - 1.27 mg/dL 1.09 1.01 0.91  Sodium 134 - 144 mmol/L 131(L) 137 136  Potassium 3.5 - 5.2 mmol/L 4.4 4.3 4.0  Chloride 96 - 106 mmol/L 96 98 100  CO2 20 - 29 mmol/L 20 20 20   Calcium 8.6 - 10.2 mg/dL 9.2 9.4 9.4    Lipid Panel      Component Value Date/Time   CHOL 232 (H) 06/21/2020 0850   TRIG 81 06/21/2020 0850   HDL 84 06/21/2020 0850   CHOLHDL 2.8 06/21/2020 0850   LDLCALC 134 (H) 06/21/2020 0850    CBC    Component Value Date/Time   WBC 5.0 05/17/2020 0806   RBC 5.88 (H) 05/17/2020 0806   HGB 16.2 05/17/2020 0806   HCT 44.9 05/17/2020 0806   PLT 211 05/17/2020 0806   MCV 76.4 (L) 05/17/2020 0806   MCH 27.6 05/17/2020 0806   MCHC 36.1 (H) 05/17/2020 0806   RDW 13.8 05/17/2020 0806   LYMPHSABS 1.9 05/17/2020 0806   MONOABS 0.5 05/17/2020 0806   EOSABS 0.1 05/17/2020 0806   BASOSABS 0.0 05/17/2020 0806    No results found for: HGBA1C  The 10-year ASCVD risk score Mikey Bussing DC Jr., et al., 2013) is: 27.2%   Values used to calculate the score:     Age: 78 years     Sex: Male     Is Non-Hispanic African American: Yes     Diabetic: No     Tobacco smoker: Yes     Systolic Blood Pressure: 354 mmHg     Is BP treated: Yes     HDL Cholesterol: 84 mg/dL     Total Cholesterol: 232 mg/dL  Assessment & Plan:  1. Pure hypercholesterolemia Uncontrolled 10-year ASCVD risk score of 27.2% Will initiate Lipitor Low-cholesterol diet - atorvastatin (LIPITOR) 20 MG tablet; Take 1 tablet (20 mg total) by mouth daily.  Dispense: 30 tablet; Refill: 6  2. Essential hypertension Controlled Continue amlodipine, Hyzaar, spironolactone Counseled on blood pressure goal of less than 130/80, low-sodium, DASH diet, medication compliance, 150 minutes of moderate intensity exercise per week. Discussed medication compliance, adverse effects.  3. Bradycardia Asymptomatic Currently not on a beta-blocker Heart rate was normal at cardiology visit Advised that if he notices irregular heart rhythm, dizziness he needs to contact the clinic    Meds ordered this encounter  Medications  . atorvastatin (LIPITOR) 20 MG tablet    Sig: Take 1 tablet (20 mg total) by mouth daily.    Dispense:  30 tablet    Refill:  6     Follow-up: Return in about 6 months (around 05/14/2021) for Chronic medical conditions.       Charlott Rakes, MD, FAAFP. [facility], [place]   11/12/2020, 9:45 AM

## 2020-12-07 ENCOUNTER — Ambulatory Visit (INDEPENDENT_AMBULATORY_CARE_PROVIDER_SITE_OTHER): Payer: Medicare Other | Admitting: Nurse Practitioner

## 2020-12-07 VITALS — BP 110/81 | HR 68 | Temp 97.9°F | Resp 18 | Ht 69.0 in | Wt 143.0 lb

## 2020-12-07 DIAGNOSIS — Z0000 Encounter for general adult medical examination without abnormal findings: Secondary | ICD-10-CM

## 2020-12-07 NOTE — Patient Instructions (Addendum)
Isaac Chan , Thank you for taking time to come for your Medicare Wellness Visit. I appreciate your ongoing commitment to your health goals. Please review the following plan we discussed and let me know if I can assist you in the future.   These are the goals we discussed:  Goals      Quit Smoking        This is a list of the screening recommended for you and due dates:  Health Maintenance  Topic Date Due   COVID-19 Vaccine (1) Never done   HIV Screening  Never done   Hepatitis C Screening: USPSTF Recommendation to screen - Ages 66-79 yo.  Never done   Tetanus Vaccine  Never done   Zoster (Shingles) Vaccine (1 of 2) Never done   Colon Cancer Screening  Never done   Pneumonia vaccines (1 of 2 - PCV13) Never done   Flu Shot  01/07/2021   HPV Vaccine  Aged Out    Colonoscopy, Adult A colonoscopy is a procedure to look at the entire large intestine. This procedure is done using a long, thin, flexible tube that has a camera on theend. You may have a colonoscopy: As a part of normal colorectal screening. If you have certain symptoms, such as: A low number of red blood cells in your blood (anemia). Diarrhea that does not go away. Pain in your abdomen. Blood in your stool. A colonoscopy can help screen for and diagnose medical problems, including: Tumors. Extra tissue that grows where mucus forms (polyps). Inflammation. Areas of bleeding. Tell your health care provider about: Any allergies you have. All medicines you are taking, including vitamins, herbs, eye drops, creams, and over-the-counter medicines. Any problems you or family members have had with anesthetic medicines. Any blood disorders you have. Any surgeries you have had. Any medical conditions you have. Any problems you have had with having bowel movements. Whether you are pregnant or may be pregnant. What are the risks? Generally, this is a safe procedure. However, problems may occur, including: Bleeding. Damage  to your intestine. Allergic reactions to medicines given during the procedure. Infection. This is rare. What happens before the procedure? Eating and drinking restrictions Follow instructions from your health care provider about eating or drinking restrictions, which may include: A few days before the procedure: Follow a low-fiber diet. Avoid nuts, seeds, dried fruit, raw fruits, and vegetables. 1-3 days before the procedure: Eat only gelatin dessert or ice pops. Drink only clear liquids, such as water, clear juice, clear broth or bouillon, black coffee or tea, or clear soft drinks or sports drinks. Avoid liquids that contain red or purple dye. The day of the procedure: Do not eat solid foods. You may continue to drink clear liquids until up to 2 hours before the procedure. Do not eat or drink anything starting 2 hours before the procedure, or within the time period that your health care provider recommends. Bowel prep If you were prescribed a bowel prep to take by mouth (orally) to clean out your colon: Take it as told by your health care provider. Starting the day before your procedure, you will need to drink a large amount of liquid medicine. The liquid will cause you to have many bowel movements of loose stool until your stool becomes almost clear or light green. If your skin or the opening between the buttocks (anus) gets irritated from diarrhea, you may relieve the irritation using: Wipes with medicine in them, such as adult wet wipes with  aloe and vitamin E. A product to soothe skin, such as petroleum jelly. If you vomit while drinking the bowel prep: Take a break for up to 60 minutes. Begin the bowel prep again. Call your health care provider if you keep vomiting or you cannot take the bowel prep without vomiting. To clean out your colon, you may also be given: Laxative medicines. These help you have a bowel movement. Instructions for enema use. An enema is liquid medicine  injected into your rectum. Medicines Ask your health care provider about: Changing or stopping your regular medicines or supplements. This is especially important if you are taking iron supplements, diabetes medicines, or blood thinners. Taking medicines such as aspirin and ibuprofen. These medicines can thin your blood. Do not take these medicines unless your health care provider tells you to take them. Taking over-the-counter medicines, vitamins, herbs, and supplements. General instructions Ask your health care provider what steps will be taken to help prevent infection. These may include washing skin with a germ-killing soap. Plan to have someone take you home from the hospital or clinic. What happens during the procedure?  An IV will be inserted into one of your veins. You may be given one or more of the following: A medicine to help you relax (sedative). A medicine to numb the area (local anesthetic). A medicine to make you fall asleep (general anesthetic). This is rarely needed. You will lie on your side with your knees bent. The tube will: Have oil or gel put on it (be lubricated). Be inserted into your anus. Be gently eased through all parts of your large intestine. Air will be sent into your colon to keep it open. This may cause some pressure or cramping. Images will be taken with the camera and will appear on a screen. A small tissue sample may be removed to be looked at under a microscope (biopsy). The tissue may be sent to a lab for testing if any signs of problems are found. If small polyps are found, they may be removed and checked for cancer cells. When the procedure is finished, the tube will be removed. The procedure may vary among health care providers and hospitals. What happens after the procedure? Your blood pressure, heart rate, breathing rate, and blood oxygen level will be monitored until you leave the hospital or clinic. You may have a small amount of blood in  your stool. You may pass gas and have mild cramping or bloating in your abdomen. This is caused by the air that was used to open your colon during the exam. Do not drive for 24 hours after the procedure. It is up to you to get the results of your procedure. Ask your health care provider, or the department that is doing the procedure, when your results will be ready. Summary A colonoscopy is a procedure to look at the entire large intestine. Follow instructions from your health care provider about eating and drinking before the procedure. If you were prescribed an oral bowel prep to clean out your colon, take it as told by your health care provider. During the colonoscopy, a flexible tube with a camera on its end is inserted into the anus and then passed into the other parts of the large intestine. This information is not intended to replace advice given to you by your health care provider. Make sure you discuss any questions you have with your healthcare provider. Document Revised: 12/17/2018 Document Reviewed: 12/17/2018 Elsevier Patient Education  [place].  Fall Prevention in the Home, Adult Falls can cause injuries and can happen to people of all ages. There are many things you can do to make your home safe and to help prevent falls. Ask forhelp when making these changes. What actions can I take to prevent falls? General Instructions Use good lighting in all rooms. Replace any light bulbs that burn out. Turn on the lights in dark areas. Use night-lights. Keep items that you use often in easy-to-reach places. Lower the shelves around your home if needed. Set up your furniture so you have a clear path. Avoid moving your furniture around. Do not have throw rugs or other things on the floor that can make you trip. Avoid walking on wet floors. If any of your floors are uneven, fix them. Add color or contrast paint or tape to clearly mark and help you see: Grab bars or  handrails. First and last steps of staircases. Where the edge of each step is. If you use a stepladder: Make sure that it is fully opened. Do not climb a closed stepladder. Make sure the sides of the stepladder are locked in place. Ask someone to hold the stepladder while you use it. Know where your pets are when moving through your home. What can I do in the bathroom?     Keep the floor dry. Clean up any water on the floor right away. Remove soap buildup in the tub or shower. Use nonskid mats or decals on the floor of the tub or shower. Attach bath mats securely with double-sided, nonslip rug tape. If you need to sit down in the shower, use a plastic, nonslip stool. Install grab bars by the toilet and in the tub and shower. Do not use towel bars as grab bars. What can I do in the bedroom? Make sure that you have a light by your bed that is easy to reach. Do not use any sheets or blankets for your bed that hang to the floor. Have a firm chair with side arms that you can use for support when you get dressed. What can I do in the kitchen? Clean up any spills right away. If you need to reach something above you, use a step stool with a grab bar. Keep electrical cords out of the way. Do not use floor polish or wax that makes floors slippery. What can I do with my stairs? Do not leave any items on the stairs. Make sure that you have a light switch at the top and the bottom of the stairs. Make sure that there are handrails on both sides of the stairs. Fix handrails that are broken or loose. Install nonslip stair treads on all your stairs. Avoid having throw rugs at the top or bottom of the stairs. Choose a carpet that does not hide the edge of the steps on the stairs. Check carpeting to make sure that it is firmly attached to the stairs. Fix carpet that is loose or worn. What can I do on the outside of my home? Use bright outdoor lighting. Fix the edges of walkways and driveways and  fix any cracks. Remove anything that might make you trip as you walk through a door, such as a raised step or threshold. Trim any bushes or trees on paths to your home. Check to see if handrails are loose or broken and that both sides of all steps have handrails. Install guardrails along the edges of any raised decks and porches. Clear paths of anything  that can make you trip, such as tools or rocks. Have leaves, snow, or ice cleared regularly. Use sand or salt on paths during winter. Clean up any spills in your garage right away. This includes grease or oil spills. What other actions can I take? Wear shoes that: Have a low heel. Do not wear high heels. Have rubber bottoms. Feel good on your feet and fit well. Are closed at the toe. Do not wear open-toe sandals. Use tools that help you move around if needed. These include: Canes. Walkers. Scooters. Crutches. Review your medicines with your doctor. Some medicines can make you feel dizzy. This can increase your chance of falling. Ask your doctor what else you can do to help prevent falls. Where to find more information Centers for Disease Control and Prevention, [place]: http://www.wolf.info/ National Institute on Aging: http://kim-miller.com/ Contact a doctor if: You are afraid of falling at home. You feel weak, drowsy, or dizzy at home. You fall at home. Summary There are many simple things that you can do to make your home safe and to help prevent falls. Ways to make your home safe include removing things that can make you trip and installing grab bars in the bathroom. Ask for help when making these changes in your home. This information is not intended to replace advice given to you by your health care provider. Make sure you discuss any questions you have with your healthcare provider. Document Revised: 12/28/2019 Document Reviewed: 12/28/2019 Elsevier Patient Education  [place] Maintenance, Male Adopting a healthy  lifestyle and getting preventive care are important in promoting health and wellness. Ask your health care provider about: The right schedule for you to have regular tests and exams. Things you can do on your own to prevent diseases and keep yourself healthy. What should I know about diet, weight, and exercise? Eat a healthy diet  Eat a diet that includes plenty of vegetables, fruits, low-fat dairy products, and lean protein. Do not eat a lot of foods that are high in solid fats, added sugars, or sodium.  Maintain a healthy weight Body mass index (BMI) is a measurement that can be used to identify possible weight problems. It estimates body fat based on height and weight. Your health care provider can help determine your BMI and help you achieve or maintain ahealthy weight. Get regular exercise Get regular exercise. This is one of the most important things you can do for your health. Most adults should: Exercise for at least 150 minutes each week. The exercise should increase your heart rate and make you sweat (moderate-intensity exercise). Do strengthening exercises at least twice a week. This is in addition to the moderate-intensity exercise. Spend less time sitting. Even light physical activity can be beneficial. Watch cholesterol and blood lipids Have your blood tested for lipids and cholesterol at 65 years of age, then havethis test every 5 years. You may need to have your cholesterol levels checked more often if: Your lipid or cholesterol levels are high. You are older than 65 years of age. You are at high risk for heart disease. What should I know about cancer screening? Many types of cancers can be detected early and may often be prevented. Depending on your health history and family history, you may need to have cancer screening at various ages. This may include screening for: Colorectal cancer. Prostate cancer. Skin cancer. Lung cancer. What should I know about heart disease,  diabetes, and high blood pressure? Blood pressure and  heart disease High blood pressure causes heart disease and increases the risk of stroke. This is more likely to develop in people who have high blood pressure readings, are of African descent, or are overweight. Talk with your health care provider about your target blood pressure readings. Have your blood pressure checked: Every 3-5 years if you are 30-56 years of age. Every year if you are 57 years old or older. If you are between the ages of 75 and 91 and are a current or former smoker, ask your health care provider if you should have a one-time screening for abdominal aortic aneurysm (AAA). Diabetes Have regular diabetes screenings. This checks your fasting blood sugar level. Have the screening done: Once every three years after age 60 if you are at a normal weight and have a low risk for diabetes. More often and at a younger age if you are overweight or have a high risk for diabetes. What should I know about preventing infection? Hepatitis B If you have a higher risk for hepatitis B, you should be screened for this virus. Talk with your health care provider to find out if you are at risk forhepatitis B infection. Hepatitis C Blood testing is recommended for: Everyone born from [birth year] through 1965. Anyone with known risk factors for hepatitis C. Sexually transmitted infections (STIs) You should be screened each year for STIs, including gonorrhea and chlamydia, if: You are sexually active and are younger than 65 years of age. You are older than 65 years of age and your health care provider tells you that you are at risk for this type of infection. Your sexual activity has changed since you were last screened, and you are at increased risk for chlamydia or gonorrhea. Ask your health care provider if you are at risk. Ask your health care provider about whether you are at high risk for HIV. Your health care provider may recommend a  prescription medicine to help prevent HIV infection. If you choose to take medicine to prevent HIV, you should first get tested for HIV. You should then be tested every 3 months for as long as you are taking the medicine. Follow these instructions at home: Lifestyle Do not use any products that contain nicotine or tobacco, such as cigarettes, e-cigarettes, and chewing tobacco. If you need help quitting, ask your health care provider. Do not use street drugs. Do not share needles. Ask your health care provider for help if you need support or information about quitting drugs. Alcohol use Do not drink alcohol if your health care provider tells you not to drink. If you drink alcohol: Limit how much you have to 0-2 drinks a day. Be aware of how much alcohol is in your drink. In the [place] one drink equals one 12 oz bottle of beer (355 mL), one 5 oz glass of wine (148 mL), or one 1 oz glass of hard liquor (44 mL). General instructions Schedule regular health, dental, and eye exams. Stay current with your vaccines. Tell your health care provider if: You often feel depressed. You have ever been abused or do not feel safe at home. Summary Adopting a healthy lifestyle and getting preventive care are important in promoting health and wellness. Follow your health care provider's instructions about healthy diet, exercising, and getting tested or screened for diseases. Follow your health care provider's instructions on monitoring your cholesterol and blood pressure. This information is not intended to replace advice given to you by your health care provider. Make sure  you discuss any questions you have with your healthcare provider. Document Revised: 05/19/2018 Document Reviewed: 05/19/2018 Elsevier Patient Education  2022 [place].  Steps to Quit Smoking Smoking tobacco is the leading cause of preventable death. It can affect almost every organ in the body. Smoking puts you and people around you  at risk for many serious, long-lasting (chronic) diseases. Quitting smoking can be hard, but it is one of the best things thatyou can do for your health. It is never too late to quit. How do I get ready to quit? When you decide to quit smoking, make a plan to help you succeed. Before you quit: Pick a date to quit. Set a date within the next 2 weeks to give you time to prepare. Write down the reasons why you are quitting. Keep this list in places where you will see it often. Tell your family, friends, and co-workers that you are quitting. Their support is important. Talk with your doctor about the choices that may help you quit. Find out if your health insurance will pay for these treatments. Know the people, places, things, and activities that make you want to smoke (triggers). Avoid them. What first steps can I take to quit smoking? Throw away all cigarettes at home, at work, and in your car. Throw away the things that you use when you smoke, such as ashtrays and lighters. Clean your car. Make sure to empty the ashtray. Clean your home, including curtains and carpets. What can I do to help me quit smoking? Talk with your doctor about taking medicines and seeing a counselor at the same time. You are more likely to succeed when you do both. If you are pregnant or breastfeeding, talk with your doctor about counseling or other ways to quit smoking. Do not take medicine to help you quit smoking unless your doctor tells you to do so. To quit smoking: Quit right away Quit smoking totally, instead of slowly cutting back on how much you smoke over a period of time. Go to counseling. You are more likely to quit if you go to counseling sessions regularly. Take medicine You may take medicines to help you quit. Some medicines need a prescription, and some you can buy over-the-counter. Some medicines may contain a drug called nicotine to replace the nicotine in cigarettes. Medicines may: Help you to stop  having the desire to smoke (cravings). Help to stop the problems that come when you stop smoking (withdrawal symptoms). Your doctor may ask you to use: Nicotine patches, gum, or lozenges. Nicotine inhalers or sprays. Non-nicotine medicine that is taken by mouth. Find resources Find resources and other ways to help you quit smoking and remain smoke-free after you quit. These resources are most helpful when you use them often. They include: Online chats with a Social worker. Phone quitlines. Printed Furniture conservator/restorer. Support groups or group counseling. Text messaging programs. Mobile phone apps. Use apps on your mobile phone or tablet that can help you stick to your quit plan. There are many free apps for mobile phones and tablets as well as websites. Examples include Quit Guide from the [employer] and smokefree.gov  What things can I do to make it easier to quit?  Talk to your family and friends. Ask them to support and encourage you. Call a phone quitline (1-800-QUIT-NOW), reach out to support groups, or work with a Social worker. Ask people who smoke to not smoke around you. Avoid places that make you want to smoke, such as: Bars. Parties.  Smoke-break areas at work. Spend time with people who do not smoke. Lower the stress in your life. Stress can make you want to smoke. Try these things to help your stress: Getting regular exercise. Doing deep-breathing exercises. Doing yoga. Meditating. Doing a body scan. To do this, close your eyes, focus on one area of your body at a time from head to toe. Notice which parts of your body are tense. Try to relax the muscles in those areas. How will I feel when I quit smoking? Day 1 to 3 weeks Within the first 24 hours, you may start to have some problems that come from quitting tobacco. These problems are very bad 2-3 days after you quit, but they do not often last for more than 2-3 weeks. You may get these symptoms: Mood swings. Feeling restless, nervous,  angry, or annoyed. Trouble concentrating. Dizziness. Strong desire for high-sugar foods and nicotine. Weight gain. Trouble pooping (constipation). Feeling like you may vomit (nausea). Coughing or a sore throat. Changes in how the medicines that you take for other issues work in your body. Depression. Trouble sleeping (insomnia). Week 3 and afterward After the first 2-3 weeks of quitting, you may start to notice more positive results, such as: Better sense of smell and taste. Less coughing and sore throat. Slower heart rate. Lower blood pressure. Clearer skin. Better breathing. Fewer sick days. Quitting smoking can be hard. Do not give up if you fail the first time. Some people need to try a few times before they succeed. Do your best to stick to your quit plan, and talk with yourdoctor if you have any questions or concerns. Summary Smoking tobacco is the leading cause of preventable death. Quitting smoking can be hard, but it is one of the best things that you can do for your health. When you decide to quit smoking, make a plan to help you succeed. Quit smoking right away, not slowly over a period of time. When you start quitting, seek help from your doctor, family, or friends. This information is not intended to replace advice given to you by your health care provider. Make sure you discuss any questions you have with your healthcare provider. Document Revised: 02/18/2019 Document Reviewed: 08/14/2018 Elsevier Patient Education  [place].

## 2020-12-07 NOTE — Progress Notes (Signed)
Subjective:   Isaac Chan is a 65 y.o. male who presents for Medicare Annual/Subsequent preventive examination.  Review of Systems    Review of Systems  Constitutional: Negative.   HENT: Negative.    Eyes: Negative.   Respiratory: Negative.    Cardiovascular: Negative.   Gastrointestinal: Negative.   Genitourinary: Negative.   Musculoskeletal: Negative.   Skin: Negative.   Neurological: Negative.   Endo/Heme/Allergies: Negative.   Psychiatric/Behavioral: Negative.     Cardiac Risk Factors include: advanced age (>62men, >45 women);hypertension;dyslipidemia;male gender;sedentary lifestyle;smoking/ tobacco exposure     Objective:    Today's Vitals   12/07/20 1004  BP: 110/81  Pulse: 68  Resp: 18  Temp: 97.9 F (36.6 C)  SpO2: 100%  Weight: 143 lb (64.9 kg)  Height: 5\' 9"  (1.753 m)   Body mass index is 21.12 kg/m.  Advanced Directives 12/07/2020 05/17/2020 12/17/2017 06/29/2017  Does Patient Have a Medical Advance Directive? No No No No  Would patient like information on creating a medical advance directive? No - Patient declined - No - Patient declined No - Patient declined    Current Medications (verified) Outpatient Encounter Medications as of 12/07/2020  Medication Sig   amLODipine (NORVASC) 10 MG tablet Take 1 tablet (10 mg total) by mouth daily.   atorvastatin (LIPITOR) 20 MG tablet Take 1 tablet (20 mg total) by mouth daily.   losartan-hydrochlorothiazide (HYZAAR) 100-25 MG tablet Take 1 tablet by mouth daily.   spironolactone (ALDACTONE) 25 MG tablet Take 1 tablet (25 mg total) by mouth daily.   No facility-administered encounter medications on file as of 12/07/2020.    Allergies (verified) Patient has no known allergies.   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 Social History   Socioeconomic History   Marital status: Married    Spouse name: Not on file   Number of children: Not on file   Years of education: Not on file   Highest education level: Not on file  Occupational History   Not on file  Tobacco Use   Smoking status: Every Day    Pack years: 0.00   Smokeless tobacco: Never  Substance and Sexual Activity   Alcohol use: Yes   Drug use: Not Currently   Sexual activity: Not on file  Other Topics Concern   Not on file  Social History Narrative   Not on file   Social Determinants of Health   Financial Resource Strain: Low Risk    Difficulty of Paying Living Expenses: Not hard at all  Food Insecurity: No Food Insecurity   Worried About [place] in the Last Year: Never true   [place] in the Last Year: Never true  Transportation Needs: No Transportation Needs   Lack of Transportation (Medical): No   Lack of Transportation (Non-Medical): No  Physical Activity: Inactive   Days of Exercise per Week: 0 days   Minutes of Exercise per Session: 0 min  Stress: No Stress Concern Present   Feeling of Stress : Not at all  Social Connections: Socially Integrated   Frequency of Communication with Friends and Family: More than three  times a week   Frequency of Social Gatherings with Friends and Family: More than three times a week   Attends Religious Services: More than 4 times per year   Active Member of [employer] or Organizations: Yes   Attends Music therapist: More than 4 times per year   Marital Status: Married    Tobacco Counseling Ready to quit: No Counseling given: Yes   Clinical Intake:  Pre-visit preparation completed: No  Pain : No/denies pain     BMI - recorded: 21.12 Nutritional Status: BMI of 19-24  Normal Nutritional Risks:  None Diabetes: No  How often do you need to have someone help you when you read instructions, pamphlets, or other written materials from your doctor or pharmacy?: 1 - Never What is the last grade level you completed in school?: 11th  Diabetic?NO  Interpreter Needed?: No      Activities of Daily Living In your present state of health, do you have any difficulty performing the following activities: 12/07/2020  Hearing? N  Vision? N  Difficulty concentrating or making decisions? N  Walking or climbing stairs? N  Dressing or bathing? N  Doing errands, shopping? N  Preparing Food and eating ? N  Using the Toilet? N  In the past six months, have you accidently leaked urine? N  Do you have problems with loss of bowel control? N  Managing your Medications? N  Managing your Finances? N  Housekeeping or managing your Housekeeping? N  Some recent data might be hidden    Patient Care Team: Charlott Rakes, MD as PCP - General (Family Medicine)  Indicate any recent Medical Services you may have received from other than [facility] providers in the past year (date may be approximate).     Assessment:   This is a routine wellness examination for Isaac Chan.  Hearing/Vision screen No results found.  Dietary issues and exercise activities discussed: Current Exercise Habits: The patient does not participate in regular exercise at present, Exercise limited by: None identified   Goals Addressed             This Visit's Progress    Quit Smoking         Depression Screen PHQ 2/9 Scores 12/07/2020 11/12/2020 08/08/2020  PHQ - 2 Score 0 0 0  PHQ- 9 Score - 0 0    Fall Risk Fall Risk  12/07/2020 08/08/2020  Falls in the past year? 0 0  Number falls in past yr: 0 0  Injury with Fall? 0 0  Risk for fall due to : No Fall Risks -  Follow up Education provided -    [place]:  Any stairs in or around the home? Yes  If so, are there any without handrails? No   Home free of loose throw rugs in walkways, pet beds, electrical cords, etc? Yes  Adequate lighting in your home to reduce risk of falls? Yes   ASSISTIVE DEVICES UTILIZED TO PREVENT FALLS:  Life alert? No  Use of a cane, walker or w/c? No  Grab bars in the bathroom? Yes  Shower chair or bench in shower? No  Elevated toilet seat or a handicapped toilet? No   TIMED UP AND GO:  Was the test performed? Yes .  Length of time to ambulate 10 feet: 3 sec.   Gait steady and fast without use of assistive device  Cognitive Function: MMSE - Mini Mental State Exam 12/07/2020  Orientation to time 5  Orientation to Place 5  Registration 3  Attention/ Calculation 5  Recall 3  Language- name 2 objects 2  Language- repeat 1  Language- follow 3 step command 3  Language- read & follow direction 1  Write a sentence 1  Copy design 1  Total score 30        Immunizations  There is no immunization history on file for this patient.  TDAP status: Due, Education has been provided regarding the importance of this vaccine. Advised may receive this vaccine at local pharmacy or Health Dept. Aware to provide a copy of the vaccination record if obtained from local pharmacy or Health Dept. Verbalized acceptance and understanding.  Flu Vaccine status: Due, Education has been provided regarding the importance of this vaccine. Advised may receive this vaccine at local pharmacy or Health Dept. Aware to provide a copy of the vaccination record if obtained from local pharmacy or Health Dept. Verbalized acceptance and understanding.  Pneumococcal vaccine status: Due, Education has been provided regarding the importance of this vaccine. Advised may receive this vaccine at local pharmacy or Health Dept. Aware to provide a copy of the vaccination record if obtained from local pharmacy or Health Dept. Verbalized acceptance and understanding.  Covid-19 vaccine status: Completed vaccines  Qualifies for Shingles  Vaccine? No   Zostavax completed No   Shingrix Completed?: No.    Education has been provided regarding the importance of this vaccine. Patient has been advised to call insurance company to determine out of pocket expense if they have not yet received this vaccine. Advised may also receive vaccine at local pharmacy or Health Dept. Verbalized acceptance and understanding.  Screening Tests Health Maintenance  Topic Date Due   COVID-19 Vaccine (1) Never done   HIV Screening  Never done   Hepatitis C Screening  Never done   TETANUS/TDAP  Never done   Zoster Vaccines- Shingrix (1 of 2) Never done   COLONOSCOPY (Pts 45-32yrs Insurance coverage will need to be confirmed)  Never done   PNA vac Low Risk Adult (1 of 2 - PCV13) Never done   INFLUENZA VACCINE  01/07/2021   HPV VACCINES  Aged Out    Health Maintenance  Health Maintenance Due  Topic Date Due   COVID-19 Vaccine (1) Never done   HIV Screening  Never done   Hepatitis C Screening  Never done   TETANUS/TDAP  Never done   Zoster Vaccines- Shingrix (1 of 2) Never done   COLONOSCOPY (Pts 45-4yrs Insurance coverage will need to be confirmed)  Never done   PNA vac Low Risk Adult (1 of 2 - PCV13) Never done    Colorectal cancer screening: Type of screening: Colonoscopy. Completed  . Repeat every 5 years  Lung Cancer Screening: (Low Dose CT Chest recommended if Age 70-80 years, 30 pack-year currently smoking OR have quit w/in 15years.) does qualify.   Lung Cancer Screening Referral: NA  Additional Screening:  Hepatitis C Screening: does qualify; to be completed by PCP  Vision Screening: Recommended annual ophthalmology exams for early detection of glaucoma and other disorders of the eye. Is the patient up to date with their annual eye exam?  No  Who is the provider or what is the name of the office in which the patient attends annual eye exams? [facility] If pt is not established with a provider, would they like to be  referred to a provider to establish care?  na .   Dental Screening: Recommended annual dental  exams for proper oral hygiene  Community Resource Referral / Chronic Care Management: CRR required this visit?  No   CCM required this visit?  No      Plan:     I have personally reviewed and noted the following in the patient's chart:   Medical and social history Use of alcohol, tobacco or illicit drugs  Current medications and supplements including opioid prescriptions. Patient is not currently taking opioid prescriptions. Functional ability and status Nutritional status Physical activity Advanced directives List of other physicians Hospitalizations, surgeries, and ER visits in previous 12 months Vitals Screenings to include cognitive, depression, and falls Referrals and appointments  In addition, I have reviewed and discussed with patient certain preventive protocols, quality metrics, and best practice recommendations. A written personalized care plan for preventive services as well as general preventive health recommendations were provided to patient.     Fenton Foy, NP   12/07/2020

## 2021-01-28 ENCOUNTER — Other Ambulatory Visit: Payer: Self-pay | Admitting: Family Medicine

## 2021-01-28 DIAGNOSIS — I10 Essential (primary) hypertension: Secondary | ICD-10-CM

## 2021-01-28 NOTE — Telephone Encounter (Signed)
Requested Prescriptions  Pending Prescriptions Disp Refills  . losartan-hydrochlorothiazide (HYZAAR) 100-25 MG tablet [Pharmacy Med Name: LOSARTAN/HCTZ 100/'25MG'$  TABLETS] 90 tablet 0    Sig: TAKE 1 TABLET BY MOUTH DAILY     Cardiovascular: ARB + Diuretic Combos Failed - 01/28/2021  6:02 PM      Failed - Na in normal range and within 180 days    Sodium  Date Value Ref Range Status  10/08/2020 131 (L) 134 - 144 mmol/L Final         Passed - K in normal range and within 180 days    Potassium  Date Value Ref Range Status  10/08/2020 4.4 3.5 - 5.2 mmol/L Final         Passed - Cr in normal range and within 180 days    Creatinine, Ser  Date Value Ref Range Status  10/08/2020 1.09 0.76 - 1.27 mg/dL Final         Passed - Ca in normal range and within 180 days    Calcium  Date Value Ref Range Status  10/08/2020 9.2 8.6 - 10.2 mg/dL Final         Passed - Patient is not pregnant      Passed - Last BP in normal range    BP Readings from Last 1 Encounters:  12/07/20 110/81         Passed - Valid encounter within last 6 months    Recent Outpatient Visits          2 months ago Pure hypercholesterolemia   [place], Charlane Ferretti, MD   3 months ago Essential hypertension   [place], Jarome Matin, RPH-CPP   4 months ago Essential hypertension   [place], Jarome Matin, RPH-CPP   5 months ago Essential hypertension   [place], Jarome Matin, RPH-CPP   5 months ago Essential hypertension   [place], [place], MD      Future Appointments            In 3 months Charlott Rakes, MD [place]

## 2021-03-07 ENCOUNTER — Telehealth: Payer: Self-pay | Admitting: Family Medicine

## 2021-03-07 ENCOUNTER — Other Ambulatory Visit: Payer: Self-pay | Admitting: Cardiology

## 2021-03-07 NOTE — Telephone Encounter (Signed)
Copied from [place] [phone]. Topic: General - Other >> Mar 07, 2021  1:10 PM Leward Quan A wrote: Reason for CRM: Patient called in to inform Dr Margarita Rana that he want to have his kidneys checked to see what is causing his back pain would like to be seen before his December visit. Please call  Ph# [phone]

## 2021-03-07 NOTE — Telephone Encounter (Signed)
Does patient need to come in for lab work or UA?

## 2021-03-08 NOTE — Telephone Encounter (Signed)
Can we add this patient to cari schedule once she starts patients here at [facility].

## 2021-03-08 NOTE — Telephone Encounter (Signed)
He does have chronic low back pain but can be scheduled for an earlier visit to evaluate his back pain with any clinician with an earlier opening.

## 2021-03-12 NOTE — Telephone Encounter (Signed)
Patient has been scheduled with Cari on 10/12 at 9:30

## 2021-03-13 ENCOUNTER — Other Ambulatory Visit: Payer: Self-pay | Admitting: Family Medicine

## 2021-03-13 DIAGNOSIS — I10 Essential (primary) hypertension: Secondary | ICD-10-CM

## 2021-03-20 ENCOUNTER — Ambulatory Visit: Payer: Medicare Other | Attending: Physician Assistant | Admitting: Physician Assistant

## 2021-03-20 ENCOUNTER — Encounter: Payer: Self-pay | Admitting: Physician Assistant

## 2021-03-20 ENCOUNTER — Other Ambulatory Visit: Payer: Self-pay

## 2021-03-20 VITALS — BP 139/90 | HR 51 | Temp 98.2°F | Resp 18 | Ht 69.0 in | Wt 142.0 lb

## 2021-03-20 DIAGNOSIS — I10 Essential (primary) hypertension: Secondary | ICD-10-CM

## 2021-03-20 DIAGNOSIS — M5441 Lumbago with sciatica, right side: Secondary | ICD-10-CM | POA: Diagnosis not present

## 2021-03-20 LAB — POCT URINALYSIS DIP (CLINITEK)
Bilirubin, UA: NEGATIVE
Blood, UA: NEGATIVE
Glucose, UA: NEGATIVE mg/dL
Ketones, POC UA: NEGATIVE mg/dL
Leukocytes, UA: NEGATIVE
Nitrite, UA: NEGATIVE
POC PROTEIN,UA: NEGATIVE
Spec Grav, UA: >=1.03 — AB (ref 1.010–1.025)
Urobilinogen, UA: 0.2 E.U./dL
pH, UA: 6 (ref 5.0–8.0)

## 2021-03-20 MED ORDER — METHYLPREDNISOLONE SODIUM SUCC 125 MG IJ SOLR
125.0000 mg | Freq: Once | INTRAMUSCULAR | Status: AC
  Administered 2021-03-20: 125 mg via INTRAMUSCULAR

## 2021-03-20 MED ORDER — KETOROLAC TROMETHAMINE 60 MG/2ML IM SOLN
60.0000 mg | Freq: Once | INTRAMUSCULAR | Status: AC
  Administered 2021-03-20: 60 mg via INTRAMUSCULAR

## 2021-03-20 NOTE — Progress Notes (Signed)
Patient has eaten and taken medication today Patient reports nagging ache in the lower back for months beginning on the left side an now present for the right. Pain is increased with standing.

## 2021-03-20 NOTE — Patient Instructions (Addendum)
To help with your back pain, I do encourage you to drink plenty of water, use ice, gentle stretching, and ibuprofen 200 to 400 mg every 6 hours as needed.  I hope that you feel better soon, please let us know if there is anything else we can do for you  Kennieth Rad, PA-C Physician Assistant [place] [email or web address]  Sciatica Sciatica is pain, numbness, weakness, or tingling along the path of the sciatic nerve. The sciatic nerve starts in the lower back and runs down the back of each leg. The nerve controls the muscles in the lower leg and in the back of the knee. It also provides feeling (sensation) to the back of the thigh, the lower leg, and the sole of the foot. Sciatica is a symptom of another medical condition that pinches or puts pressure on the sciatic nerve. Sciatica most often only affects one side of the body. Sciatica usually goes away on its own or with treatment. In some cases, sciatica may come back (recur). What are the causes? This condition is caused by pressure on the sciatic nerve or pinching of the nerve. This may be the result of: A disk in between the bones of the spine bulging out too far (herniated disk). Age-related changes in the spinal disks. A pain disorder that affects a muscle in the buttock. Extra bone growth near the sciatic nerve. A break (fracture) of the pelvis. Pregnancy. Tumor. This is rare. What increases the risk? The following factors may make you more likely to develop this condition: Playing sports that place pressure or stress on the spine. Having poor strength and flexibility. A history of back injury or surgery. Sitting for long periods of time. Doing activities that involve repetitive bending or lifting. Obesity. What are the signs or symptoms? Symptoms can vary from mild to very severe, and they may include: Any of these problems in the lower back, leg, hip, or buttock: Mild  tingling, numbness, or dull aches. Burning sensations. Sharp pains. Numbness in the back of the calf or the sole of the foot. Leg weakness. Severe back pain that makes movement difficult. Symptoms may get worse when you cough, sneeze, or laugh, or when you sit or stand for long periods of time. How is this diagnosed? This condition may be diagnosed based on: Your symptoms and medical history. A physical exam. Blood tests. Imaging tests, such as: X-rays. MRI. CT scan. How is this treated? In many cases, this condition improves on its own without treatment. However, treatment may include: Reducing or modifying physical activity. Exercising and stretching. Icing and applying heat to the affected area. Medicines that help to: Relieve pain and swelling. Relax your muscles. Injections of medicines that help to relieve pain, irritation, and inflammation around the sciatic nerve (steroids). Surgery. Follow these instructions at home: Medicines Take over-the-counter and prescription medicines only as told by your health care provider. Ask your health care provider if the medicine prescribed to you: Requires you to avoid driving or using heavy machinery. Can cause constipation. You may need to take these actions to prevent or treat constipation: Drink enough fluid to keep your urine pale yellow. Take over-the-counter or prescription medicines. Eat foods that are high in fiber, such as beans, whole grains, and fresh fruits and vegetables. Limit foods that are high in fat and processed sugars, such as fried or sweet foods. Managing pain   If directed, put ice on the affected area. Put ice in a plastic bag. Place  a towel between your skin and the bag. Leave the ice on for 20 minutes, 2-3 times a day. If directed, apply heat to the affected area. Use the heat source that your health care provider recommends, such as a moist heat pack or a heating pad. Place a towel between your skin and  the heat source. Leave the heat on for 20-30 minutes. Remove the heat if your skin turns bright red. This is especially important if you are unable to feel pain, heat, or cold. You may have a greater risk of getting burned. Activity  Return to your normal activities as told by your health care provider. Ask your health care provider what activities are safe for you. Avoid activities that make your symptoms worse. Take brief periods of rest throughout the day. When you rest for longer periods, mix in some mild activity or stretching between periods of rest. This will help to prevent stiffness and pain. Avoid sitting for long periods of time without moving. Get up and move around at least one time each hour. Exercise and stretch regularly, as told by your health care provider. Do not lift anything that is heavier than 10 lb (4.5 kg) while you have symptoms of sciatica. When you do not have symptoms, you should still avoid heavy lifting, especially repetitive heavy lifting. When you lift objects, always use proper lifting technique, which includes: Bending your knees. Keeping the load close to your body. Avoiding twisting. General instructions Maintain a healthy weight. Excess weight puts extra stress on your back. Wear supportive, comfortable shoes. Avoid wearing high heels. Avoid sleeping on a mattress that is too soft or too hard. A mattress that is firm enough to support your back when you sleep may help to reduce your pain. Keep all follow-up visits as told by your health care provider. This is important. Contact a health care provider if: You have pain that: Wakes you up when you are sleeping. Gets worse when you lie down. Is worse than you have experienced in the past. Lasts longer than 4 weeks. You have an unexplained weight loss. Get help right away if: You are not able to control when you urinate or have bowel movements (incontinence). You have: Weakness in your lower back,  pelvis, buttocks, or legs that gets worse. Redness or swelling of your back. A burning sensation when you urinate. Summary Sciatica is pain, numbness, weakness, or tingling along the path of the sciatic nerve. This condition is caused by pressure on the sciatic nerve or pinching of the nerve. Sciatica can cause pain, numbness, or tingling in the lower back, legs, hips, and buttocks. Treatment often includes rest, exercise, medicines, and applying ice or heat. This information is not intended to replace advice given to you by your health care provider. Make sure you discuss any questions you have with your health care provider. Document Revised: 06/14/2018 Document Reviewed: 06/14/2018 Elsevier Patient Education  2022 [place].  Sciatica Rehab Ask your health care provider which exercises are safe for you. Do exercises exactly as told by your health care provider and adjust them as directed. It is normal to feel mild stretching, pulling, tightness, or discomfort as you do these exercises. Stop right away if you feel sudden pain or your pain gets worse. Do not begin these exercises until told by your health care provider. Stretching and range-of-motion exercises These exercises warm up your muscles and joints and improve the movement and flexibility of your hips and back. These exercises also  help to relieve pain, numbness, and tingling. Sciatic nerve glide Sit in a chair with your head facing down toward your chest. Place your hands behind your back. Let your shoulders slump forward. Slowly straighten one of your legs while you tilt your head back as if you are looking toward the ceiling. Only straighten your leg as far as you can without making your symptoms worse. Hold this position for __________ seconds. Slowly return to the starting position. Repeat with your other leg. Repeat __________ times. Complete this exercise __________ times a day. Knee to chest with hip adduction and  internal rotation  Lie on your back on a firm surface with both legs straight. Bend one of your knees and move it up toward your chest until you feel a gentle stretch in your lower back and buttock. Then, move your knee toward the shoulder that is on the opposite side from your leg. This is hip adduction and internal rotation. Hold your leg in this position by holding on to the front of your knee. Hold this position for __________ seconds. Slowly return to the starting position. Repeat with your other leg. Repeat __________ times. Complete this exercise __________ times a day. Prone extension on elbows  Lie on your abdomen on a firm surface. A bed may be too soft for this exercise. Prop yourself up on your elbows. Use your arms to help lift your chest up until you feel a gentle stretch in your abdomen and your lower back. This will place some of your body weight on your elbows. If this is uncomfortable, try stacking pillows under your chest. Your hips should stay down, against the surface that you are lying on. Keep your hip and back muscles relaxed. Hold this position for __________ seconds. Slowly relax your upper body and return to the starting position. Repeat __________ times. Complete this exercise __________ times a day. Strengthening exercises These exercises build strength and endurance in your back. Endurance is the ability to use your muscles for a long time, even after they get tired. Pelvic tilt This exercise strengthens the muscles that lie deep in the abdomen. Lie on your back on a firm surface. Bend your knees and keep your feet flat on the floor. Tense your abdominal muscles. Tip your pelvis up toward the ceiling and flatten your lower back into the floor. To help with this exercise, you may place a small towel under your lower back and try to push your back into the towel. Hold this position for __________ seconds. Let your muscles relax completely before you repeat this  exercise. Repeat __________ times. Complete this exercise __________ times a day. Alternating arm and leg raises  Get on your hands and knees on a firm surface. If you are on a hard floor, you may want to use padding, such as an exercise mat, to cushion your knees. Line up your arms and legs. Your hands should be directly below your shoulders, and your knees should be directly below your hips. Lift your left leg behind you. At the same time, raise your right arm and straighten it in front of you. Do not lift your leg higher than your hip. Do not lift your arm higher than your shoulder. Keep your abdominal and back muscles tight. Keep your hips facing the ground. Do not arch your back. Keep your balance carefully, and do not hold your breath. Hold this position for __________ seconds. Slowly return to the starting position. Repeat with your right leg and  your left arm. Repeat __________ times. Complete this exercise __________ times a day. Posture and body mechanics Good posture and healthy body mechanics can help to relieve stress in your body's tissues and joints. Body mechanics refers to the movements and positions of your body while you do your daily activities. Posture is part of body mechanics. Good posture means: Your spine is in its natural S-curve position (neutral). Your shoulders are pulled back slightly. Your head is not tipped forward. Follow these guidelines to improve your posture and body mechanics in your everyday activities. Standing  When standing, keep your spine neutral and your feet about hip width apart. Keep a slight bend in your knees. Your ears, shoulders, and hips should line up. When you do a task in which you stand in one place for a long time, place one foot up on a stable object that is 2-4 inches (5-10 cm) high, such as a footstool. This helps keep your spine neutral. Sitting  When sitting, keep your spine neutral and keep your feet flat on the floor. Use a  footrest, if necessary, and keep your thighs parallel to the floor. Avoid rounding your shoulders, and avoid tilting your head forward. When working at a desk or a computer, keep your desk at a height where your hands are slightly lower than your elbows. Slide your chair under your desk so you are close enough to maintain good posture. When working at a computer, place your monitor at a height where you are looking straight ahead and you do not have to tilt your head forward or downward to look at the screen. Resting When lying down and resting, avoid positions that are most painful for you. If you have pain with activities such as sitting, bending, stooping, or squatting, lie in a position in which your body does not bend very much. For example, avoid curling up on your side with your arms and knees near your chest (fetal position). If you have pain with activities such as standing for a long time or reaching with your arms, lie with your spine in a neutral position and bend your knees slightly. Try the following positions: Lying on your side with a pillow between your knees. Lying on your back with a pillow under your knees. Lifting  When lifting objects, keep your feet at least shoulder width apart and tighten your abdominal muscles. Bend your knees and hips and keep your spine neutral. It is important to lift using the strength of your legs, not your back. Do not lock your knees straight out. Always ask for help to lift heavy or awkward objects. This information is not intended to replace advice given to you by your health care provider. Make sure you discuss any questions you have with your health care provider. Document Revised: 09/17/2018 Document Reviewed: 06/17/2018 Elsevier Patient Education  [place].

## 2021-03-20 NOTE — Progress Notes (Signed)
Established Patient Office Visit  Subjective:  Patient ID: Isaac Chan, male    DOB: 10-16-1955  Age: 65 y.o. MRN: [id number]  CC:  Chief Complaint  Patient presents with   Back Pain    HPI Isaac Chan reports that he has been having low back pain for the past couple of months.  Reports that he was on the left side and now has been present on the right side.  Reports that the pain began shortly after waking in the morning, worse as he is standing or bending over while working.  Does endorse pain radiation down the back of his right leg, states it does not go past his knee.  Denies injury or trauma, does endorse history of similar back pain, states that he used to go to a chiropractor 8 or 9 years ago, states that he would go twice a week with relief of his back pain.  Denies saddle anesthesia, denies dysuria, states that he has been using ice and heat without much relief.     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  Social History   Socioeconomic History   Marital status: Married    Spouse name: Not on file   Number of children: Not on file   Years of education: Not on file   Highest education level: Not on file  Occupational History   Not on file  Tobacco Use   Smoking status: Every Day   Smokeless tobacco: Never  Substance and Sexual Activity   Alcohol use: Yes   Drug use: Not Currently   Sexual activity: Not on file  Other Topics Concern   Not on file  Social History Narrative   Not on file   Social Determinants of Health   Financial Resource Strain: Low Risk    Difficulty of Paying Living Expenses: Not hard at all  Food  Insecurity: No Food Insecurity   Worried About [place] in the Last Year: Never true   [place] in the Last Year: Never true  Transportation Needs: No Transportation Needs   Lack of Transportation (Medical): No   Lack of Transportation (Non-Medical): No  Physical Activity: Inactive   Days of Exercise per Week: 0 days   Minutes of Exercise per Session: 0 min  Stress: No Stress Concern Present   Feeling of Stress : Not at all  Social Connections: Socially Integrated   Frequency of Communication with Friends and Family: More than three times a week   Frequency of Social Gatherings with Friends and Family: More than three times a week   Attends Religious Services: More than 4 times per year   Active Member of [employer] or Organizations: Yes   Attends Music therapist: More than 4 times per year   Marital Status: Married  Human resources officer Violence: Not At Risk   Fear of Current or Ex-Partner: No   Emotionally Abused: No   Physically Abused: No   Sexually Abused: No    Outpatient Medications Prior to Visit  Medication Sig Dispense Refill   amLODipine (NORVASC) 10 MG tablet TAKE 1 TABLET(10 MG) BY  MOUTH DAILY 90 tablet 0   atorvastatin (LIPITOR) 20 MG tablet Take 1 tablet (20 mg total) by mouth daily. 30 tablet 6   losartan-hydrochlorothiazide (HYZAAR) 100-25 MG tablet TAKE 1 TABLET BY MOUTH DAILY 90 tablet 0   spironolactone (ALDACTONE) 25 MG tablet TAKE 1 TABLET(25 MG) BY MOUTH DAILY 90 tablet 1   No facility-administered medications prior to visit.    No Known Allergies  ROS Review of Systems  Constitutional:  Negative for chills and fever.  HENT: Negative.    Eyes: Negative.   Respiratory:  Negative for shortness of breath.   Cardiovascular:  Negative for chest pain.  Gastrointestinal: Negative.   Endocrine: Negative.   Genitourinary:  Negative for dysuria, hematuria, penile pain, scrotal swelling and testicular pain.  Musculoskeletal:  Positive  for back pain.  Skin: Negative.   Allergic/Immunologic: Negative.   Neurological: Negative.   Hematological: Negative.   Psychiatric/Behavioral: Negative.       Objective:    Physical Exam Vitals and nursing note reviewed.  Constitutional:      Appearance: Normal appearance.  HENT:     Head: Normocephalic and atraumatic.     Right Ear: External ear normal.     Left Ear: External ear normal.     Nose: Nose normal.     Mouth/Throat:     Mouth: Mucous membranes are moist.     Pharynx: Oropharynx is clear.  Eyes:     Extraocular Movements: Extraocular movements intact.     Conjunctiva/sclera: Conjunctivae normal.     Pupils: Pupils are equal, round, and reactive to light.  Cardiovascular:     Rate and Rhythm: Normal rate and regular rhythm.     Pulses: Normal pulses.     Heart sounds: Normal heart sounds.  Pulmonary:     Effort: Pulmonary effort is normal.     Breath sounds: Normal breath sounds.  Musculoskeletal:     Cervical back: Normal, normal range of motion and neck supple.     Thoracic back: Normal.     Lumbar back: Tenderness and bony tenderness present. Decreased range of motion.  Skin:    General: Skin is warm and dry.  Neurological:     General: No focal deficit present.     Mental Status: He is alert and oriented to person, place, and time.  Psychiatric:        Mood and Affect: Mood normal.        Behavior: Behavior normal.        Thought Content: Thought content normal.        Judgment: Judgment normal.    BP 139/90   Pulse (!) 51   Temp 98.2 F (36.8 C) (Oral)   Resp 18   Ht 5' 9"  (1.753 m)   Wt 142 lb (64.4 kg)   SpO2 98%   BMI 20.97 kg/m  Wt Readings from Last 3 Encounters:  03/20/21 142 lb (64.4 kg)  12/07/20 143 lb (64.9 kg)  11/12/20 145 lb 6.4 oz (66 kg)     Health Maintenance Due  Topic Date Due   COVID-19 Vaccine (1) Never done   HIV Screening  Never done   Hepatitis C Screening  Never done   TETANUS/TDAP  Never done   Zoster  Vaccines- Shingrix (1 of 2) Never done   COLONOSCOPY (Pts 45-40yr Insurance coverage will need to be confirmed)  Never done   INFLUENZA VACCINE  Never done    There are no preventive care reminders to display  for this patient.  No results found for: TSH Lab Results  Component Value Date   WBC 5.0 05/17/2020   HGB 16.2 05/17/2020   HCT 44.9 05/17/2020   MCV 76.4 (L) 05/17/2020   PLT 211 05/17/2020   Lab Results  Component Value Date   NA 131 (L) 10/08/2020   K 4.4 10/08/2020   CO2 20 10/08/2020   GLUCOSE 111 (H) 10/08/2020   BUN 26 10/08/2020   CREATININE 1.09 10/08/2020   CALCIUM 9.2 10/08/2020   ANIONGAP 11 05/17/2020   EGFR 75 10/08/2020   Lab Results  Component Value Date   CHOL 232 (H) 06/21/2020   Lab Results  Component Value Date   HDL 84 06/21/2020   Lab Results  Component Value Date   LDLCALC 134 (H) 06/21/2020   Lab Results  Component Value Date   TRIG 81 06/21/2020   Lab Results  Component Value Date   CHOLHDL 2.8 06/21/2020   No results found for: HGBA1C    Assessment & Plan:   Problem List Items Addressed This Visit       Cardiovascular and Mediastinum   Essential hypertension   Other Visit Diagnoses     Acute right-sided low back pain with right-sided sciatica    -  Primary   Relevant Medications   methylPREDNISolone sodium succinate (SOLU-MEDROL) 125 mg/2 mL injection 125 mg (Completed)   ketorolac (TORADOL) injection 60 mg (Completed)   Other Relevant Orders   POCT URINALYSIS DIP (CLINITEK) (Completed)       Meds ordered this encounter  Medications   methylPREDNISolone sodium succinate (SOLU-MEDROL) 125 mg/2 mL injection 125 mg   ketorolac (TORADOL) injection 60 mg   1. Acute right-sided low back pain with right-sided sciatica UA negative for UTI.  Patient education given on supportive care, trial ibuprofen over-the-counter, red flags given for prompt reevaluation. - POCT URINALYSIS DIP (CLINITEK) - methylPREDNISolone  sodium succinate (SOLU-MEDROL) 125 mg/2 mL injection 125 mg - ketorolac (TORADOL) injection 60 mg  2. Essential hypertension    I have reviewed the patient's medical history (PMH, PSH, Social History, Family History, Medications, and allergies) , and have been updated if relevant. I spent 31 minutes reviewing chart and  face to face time with patient.    Follow-up: Return if symptoms worsen or fail to improve.    Loraine Grip Mayers, PA-C

## 2021-04-20 ENCOUNTER — Other Ambulatory Visit: Payer: Self-pay | Admitting: Family Medicine

## 2021-04-20 DIAGNOSIS — I10 Essential (primary) hypertension: Secondary | ICD-10-CM

## 2021-04-20 NOTE — Telephone Encounter (Signed)
Requested medication (s) are due for refill today: NO  Requested medication (s) are on the active medication list: Yes  Last refill:  01/28/21  Future visit scheduled: Yes  Notes to clinic:  Unable to refill per protocol due to failed labs, no updated results.      Requested Prescriptions  Pending Prescriptions Disp Refills   losartan-hydrochlorothiazide (HYZAAR) 100-25 MG tablet [Pharmacy Med Name: LOSARTAN/HCTZ 100/25MG  TABLETS] 90 tablet 0    Sig: TAKE 1 TABLET BY MOUTH DAILY     Cardiovascular: ARB + Diuretic Combos Failed - 04/20/2021 11:30 AM      Failed - K in normal range and within 180 days    Potassium  Date Value Ref Range Status  10/08/2020 4.4 3.5 - 5.2 mmol/L Final          Failed - Na in normal range and within 180 days    Sodium  Date Value Ref Range Status  10/08/2020 131 (L) 134 - 144 mmol/L Final          Failed - Cr in normal range and within 180 days    Creatinine, Ser  Date Value Ref Range Status  10/08/2020 1.09 0.76 - 1.27 mg/dL Final          Failed - Ca in normal range and within 180 days    Calcium  Date Value Ref Range Status  10/08/2020 9.2 8.6 - 10.2 mg/dL Final          Failed - Last BP in normal range    BP Readings from Last 1 Encounters:  03/20/21 139/90          Passed - Patient is not pregnant      Passed - Valid encounter within last 6 months    Recent Outpatient Visits           1 month ago Acute right-sided low back pain with right-sided sciatica   [place], Cari S, PA-C   5 months ago Pure hypercholesterolemia   [place], Charlane Ferretti, MD   6 months ago Essential hypertension   [place], Jarome Matin, RPH-CPP   7 months ago Essential hypertension   [place], Stephen L, RPH-CPP   8 months ago Essential hypertension   [place], RPH-CPP       Future Appointments             In 3 weeks Charlott Rakes, MD [facility]

## 2021-05-13 ENCOUNTER — Encounter: Payer: Self-pay | Admitting: Family Medicine

## 2021-05-13 ENCOUNTER — Other Ambulatory Visit: Payer: Self-pay

## 2021-05-13 ENCOUNTER — Ambulatory Visit: Payer: Medicare Other | Attending: Family Medicine | Admitting: Family Medicine

## 2021-05-13 VITALS — BP 158/83 | HR 52 | Ht 69.0 in | Wt 147.0 lb

## 2021-05-13 DIAGNOSIS — Z23 Encounter for immunization: Secondary | ICD-10-CM | POA: Diagnosis not present

## 2021-05-13 DIAGNOSIS — E7800 Pure hypercholesterolemia, unspecified: Secondary | ICD-10-CM | POA: Diagnosis not present

## 2021-05-13 DIAGNOSIS — F17210 Nicotine dependence, cigarettes, uncomplicated: Secondary | ICD-10-CM | POA: Diagnosis not present

## 2021-05-13 DIAGNOSIS — Z1211 Encounter for screening for malignant neoplasm of colon: Secondary | ICD-10-CM

## 2021-05-13 DIAGNOSIS — Z1159 Encounter for screening for other viral diseases: Secondary | ICD-10-CM

## 2021-05-13 DIAGNOSIS — F17200 Nicotine dependence, unspecified, uncomplicated: Secondary | ICD-10-CM

## 2021-05-13 DIAGNOSIS — Z114 Encounter for screening for human immunodeficiency virus [HIV]: Secondary | ICD-10-CM

## 2021-05-13 DIAGNOSIS — I10 Essential (primary) hypertension: Secondary | ICD-10-CM

## 2021-05-13 MED ORDER — AMLODIPINE BESYLATE 10 MG PO TABS
ORAL_TABLET | ORAL | 1 refills | Status: DC
Start: 2021-05-13 — End: 2021-10-29

## 2021-05-13 MED ORDER — NICOTINE 14 MG/24HR TD PT24: 14.0000 mg | MEDICATED_PATCH | Freq: Every day | TRANSDERMAL | 1 refills | Status: DC

## 2021-05-13 NOTE — Patient Instructions (Signed)

## 2021-05-13 NOTE — Progress Notes (Signed)
Subjective:  Patient ID: Isaac Chan, male    DOB: 1955-07-01  Age: 66 y.o. MRN: [id number]  CC: Hypertension   HPI [name] is a 65 y.o. year old male with a history of Tobacco abuse, Hypertension, hyperlipidemia, tobacco abuse here for chronic disease management.  Interval History: His BP is elevated today and he did not take his antihypertensives yesterday but took it this morning. He denies smoking or drinking coffee this morning. He has no chest pains, dyspnea and has no concerns today. Endorses compliance with his statin and has no myalgias. Adhering to a low-cholesterol diet and low-sodium diet.  He smokes 1 pack cig/ 4 days 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   Dizziness 05/23/2020   Dyspnea on exertion 05/23/2020   Essential hypertension 05/23/2020   Shoulder injury, left, initial encounter 07/02/2017   Smoking 05/23/2020    Past Surgical History:  Procedure Laterality Date   R wrist fx repaired     SHOULDER ARTHROSCOPY Left     No family history on file.  No Known Allergies  Outpatient Medications Prior to Visit  Medication Sig Dispense Refill   atorvastatin (LIPITOR) 20 MG tablet Take 1 tablet (20 mg total) by mouth daily. 30 tablet 6   losartan-hydrochlorothiazide (HYZAAR) 100-25 MG tablet TAKE 1 TABLET BY MOUTH DAILY 90 tablet 1   spironolactone (ALDACTONE) 25 MG tablet TAKE 1 TABLET(25 MG) BY MOUTH DAILY 90 tablet 1   amLODipine (NORVASC) 10 MG tablet TAKE 1 TABLET(10 MG) BY MOUTH DAILY 90 tablet 0   No facility-administered medications prior to visit.     ROS Review of Systems  Constitutional:  Negative for activity change and appetite change.  HENT:  Negative for sinus pressure and sore throat.   Eyes:  Negative for visual disturbance.  Respiratory:  Negative for cough, chest tightness and shortness of breath.   Cardiovascular:  Negative for chest pain and leg swelling.  Gastrointestinal:  Negative for abdominal  distention, abdominal pain, constipation and diarrhea.  Endocrine: Negative.   Genitourinary:  Negative for dysuria.  Musculoskeletal:  Negative for joint swelling and myalgias.  Skin:  Negative for rash.  Allergic/Immunologic: Negative.   Neurological:  Negative for weakness, light-headedness and numbness.  Psychiatric/Behavioral:  Negative for dysphoric mood and suicidal ideas.    Objective:  BP (!) 158/83   Pulse (!) 52   Ht _0  (1.753 m)   Wt 147 lb (66.7 kg)   SpO2 98%   BMI 21.71 kg/m   BP/Weight 05/13/2021 09/47/0962 01/10/6628  Systolic BP 476 546 503  Diastolic BP 83 90 81  Wt. (Lbs) 147 142 143  BMI 21.71 20.97 21.12      Physical Exam Constitutional:      Appearance: He is well-developed.  Cardiovascular:     Rate and Rhythm: Bradycardia present.     Heart sounds: Normal heart sounds. No murmur heard. Pulmonary:     Effort: Pulmonary effort is normal.     Breath sounds: Normal breath sounds. No wheezing or rales.  Chest:     Chest wall: No tenderness.  Abdominal:     General: Bowel sounds are normal. There is no distension.     Palpations: Abdomen is soft. There is no mass.     Tenderness: There is no abdominal tenderness.  Musculoskeletal:        General: Normal range of motion.     Right lower leg: No edema.     Left lower leg:  No edema.  Neurological:     Mental Status: He is alert and oriented to person, place, and time.  Psychiatric:        Mood and Affect: Mood normal.    CMP Latest Ref Rng & Units 10/08/2020 09/14/2020 06/21/2020  Glucose 65 - 99 mg/dL 111(H) 102(H) 94  BUN 8 - 27 mg/dL _0 Creatinine 0.76 - 1.27 mg/dL 1.09 1.01 0.91  Sodium 134 - 144 mmol/L 131(L) 137 136  Potassium 3.5 - 5.2 mmol/L 4.4 4.3 4.0  Chloride 96 - 106 mmol/L 96 98 100  CO2 20 - 29 mmol/L _1 Calcium 8.6 - 10.2 mg/dL 9.2 9.4 9.4    Lipid Panel     Component Value Date/Time   CHOL 232 (H) 06/21/2020 0850   TRIG 81 06/21/2020 0850   HDL 84  06/21/2020 0850   CHOLHDL 2.8 06/21/2020 0850   LDLCALC 134 (H) 06/21/2020 0850    CBC    Component Value Date/Time   WBC 5.0 05/17/2020 0806   RBC 5.88 (H) 05/17/2020 0806   HGB 16.2 05/17/2020 0806   HCT 44.9 05/17/2020 0806   PLT 211 05/17/2020 0806   MCV 76.4 (L) 05/17/2020 0806   MCH 27.6 05/17/2020 0806   MCHC 36.1 (H) 05/17/2020 0806   RDW 13.8 05/17/2020 0806   LYMPHSABS 1.9 05/17/2020 0806   MONOABS 0.5 05/17/2020 0806   EOSABS 0.1 05/17/2020 0806   BASOSABS 0.0 05/17/2020 0806    No results found for: HGBA1C  The 10-year ASCVD risk score (Arnett DK, et al., 2019) is: 33.3%   Values used to calculate the score:     Age: 26 years     Sex: Male     Is Non-Hispanic African American: Yes     Diabetic: No     Tobacco smoker: Yes     Systolic Blood Pressure: 301 mmHg     Is BP treated: Yes     HDL Cholesterol: 84 mg/dL     Total Cholesterol: 232 mg/dL  Assessment & Plan:  1. Essential hypertension Uncontrolled Previous blood pressures have been controlled I will make no changes to his regimen and have him see the clinical pharmacist in 2 weeks to reassess the new blood pressure is elevated at that time consider regimen change Counseled on blood pressure goal of less than 130/80, low-sodium, DASH diet, medication compliance, 150 minutes of moderate intensity exercise per week. Discussed medication compliance, adverse effects. - CMP14+EGFR - CBC with Differential/Platelet - amLODipine (NORVASC) 10 MG tablet; TAKE 1 TABLET(10 MG) BY MOUTH DAILY  Dispense: 90 tablet; Refill: 1  2. Pure hypercholesterolemia Uncontrolled from 06/2020 20 ASCVD risk score is 33.3% Will send of lipid panel and adjust regimen accordingly Low-cholesterol diet - LP+Non-HDL Cholesterol  3. Screening for colon cancer - Ambulatory referral to Gastroenterology  4. Nicotine dependence with current use Spent 3 minutes counseling on smoking cessation and hazardous effect of ongoing use. He  would like to work on quitting and has consented to trying the nicotine patches - nicotine (NICODERM CQ) 14 mg/24hr patch; Place 1 patch (14 mg total) onto the skin daily.  Dispense: 28 patch; Refill: 1  5. Screening for viral disease - HCV Ab w Reflex to Quant PCR  6. Encounter for screening for HIV - HIV Antibody (routine testing w rflx)  7. Need for pneumococcal vaccine - Pneumococcal conjugate vaccine 20-valent    Meds ordered this encounter  Medications   nicotine (NICODERM CQ)  14 mg/24hr patch    Sig: Place 1 patch (14 mg total) onto the skin daily.    Dispense:  28 patch    Refill:  1   amLODipine (NORVASC) 10 MG tablet    Sig: TAKE 1 TABLET(10 MG) BY MOUTH DAILY    Dispense:  90 tablet    Refill:  1    Follow-up: Return in about 1 week (around 05/20/2021) for Blood pressure follow-up with Lurena Joiner; 3 months with PCP - HTN.       Charlott Rakes, MD, FAAFP. [facility], [place]   05/13/2021, 11:00 AM

## 2021-05-14 LAB — CBC WITH DIFFERENTIAL/PLATELET
Basophils Absolute: 0 10*3/uL (ref 0.0–0.2)
Basos: 1 %
EOS (ABSOLUTE): 0.1 10*3/uL (ref 0.0–0.4)
Eos: 1 %
Hematocrit: 34.9 % — ABNORMAL LOW (ref 37.5–51.0)
Hemoglobin: 12 g/dL — ABNORMAL LOW (ref 13.0–17.7)
Immature Grans (Abs): 0 10*3/uL (ref 0.0–0.1)
Immature Granulocytes: 0 %
Lymphocytes Absolute: 1.8 10*3/uL (ref 0.7–3.1)
Lymphs: 37 %
MCH: 27 pg (ref 26.6–33.0)
MCHC: 34.4 g/dL (ref 31.5–35.7)
MCV: 78 fL — ABNORMAL LOW (ref 79–97)
Monocytes Absolute: 0.5 10*3/uL (ref 0.1–0.9)
Monocytes: 10 %
Neutrophils Absolute: 2.5 10*3/uL (ref 1.4–7.0)
Neutrophils: 51 %
Platelets: 213 10*3/uL (ref 150–450)
RBC: 4.45 x10E6/uL (ref 4.14–5.80)
RDW: 15.1 % (ref 11.6–15.4)
WBC: 4.9 10*3/uL (ref 3.4–10.8)

## 2021-05-14 LAB — CMP14+EGFR
ALT: 24 IU/L (ref 0–44)
AST: 29 IU/L (ref 0–40)
Albumin/Globulin Ratio: 1.9 (ref 1.2–2.2)
Albumin: 4.8 g/dL (ref 3.8–4.8)
Alkaline Phosphatase: 50 IU/L (ref 44–121)
BUN/Creatinine Ratio: 14 (ref 10–24)
BUN: 14 mg/dL (ref 8–27)
Bilirubin Total: 0.3 mg/dL (ref 0.0–1.2)
CO2: 26 mmol/L (ref 20–29)
Calcium: 9.7 mg/dL (ref 8.6–10.2)
Chloride: 100 mmol/L (ref 96–106)
Creatinine, Ser: 1.03 mg/dL (ref 0.76–1.27)
Globulin, Total: 2.5 g/dL (ref 1.5–4.5)
Glucose: 108 mg/dL — ABNORMAL HIGH (ref 70–99)
Potassium: 4.6 mmol/L (ref 3.5–5.2)
Sodium: 139 mmol/L (ref 134–144)
Total Protein: 7.3 g/dL (ref 6.0–8.5)
eGFR: 81 mL/min/{1.73_m2} (ref >59)

## 2021-05-14 LAB — HCV AB W REFLEX TO QUANT PCR: HCV Ab: <0.1 s/co ratio (ref 0.0–0.9)

## 2021-05-14 LAB — LP+NON-HDL CHOLESTEROL
Cholesterol, Total: 235 mg/dL — ABNORMAL HIGH (ref 100–199)
HDL: 143 mg/dL (ref >39)
LDL Chol Calc (NIH): 82 mg/dL (ref 0–99)
Total Non-HDL-Chol (LDL+VLDL): 92 mg/dL (ref 0–129)
Triglycerides: 61 mg/dL (ref 0–149)
VLDL Cholesterol Cal: 10 mg/dL (ref 5–40)

## 2021-05-14 LAB — HCV INTERPRETATION

## 2021-05-14 LAB — HIV ANTIBODY (ROUTINE TESTING W REFLEX): HIV Screen 4th Generation wRfx: NONREACTIVE

## 2021-05-15 ENCOUNTER — Other Ambulatory Visit: Payer: Self-pay | Admitting: Family Medicine

## 2021-05-15 DIAGNOSIS — E7800 Pure hypercholesterolemia, unspecified: Secondary | ICD-10-CM

## 2021-05-15 MED ORDER — ATORVASTATIN CALCIUM 40 MG PO TABS: 40.0000 mg | ORAL_TABLET | Freq: Every day | ORAL | 6 refills | Status: DC

## 2021-05-17 ENCOUNTER — Telehealth: Payer: Self-pay

## 2021-05-17 NOTE — Telephone Encounter (Signed)
Patient name and DOB has been verified Patient was informed of lab results. Patient had no questions.  

## 2021-05-17 NOTE — Telephone Encounter (Signed)
-----   Message from Charlott Rakes, MD sent at 05/15/2021  5:00 PM EST ----- Please inform him that his cholesterol is elevated and I have increased atorvastatin from 20 mg to 40 mg.  Compliance with a low-cholesterol diet will be beneficial.  Thank you

## 2021-05-23 ENCOUNTER — Ambulatory Visit: Payer: Medicare Other | Admitting: Pharmacist

## 2021-05-24 ENCOUNTER — Encounter: Payer: Self-pay | Admitting: Pharmacist

## 2021-05-24 ENCOUNTER — Other Ambulatory Visit: Payer: Self-pay

## 2021-05-24 ENCOUNTER — Ambulatory Visit: Payer: Medicare Other | Attending: Family Medicine | Admitting: Pharmacist

## 2021-05-24 VITALS — BP 123/65 | HR 55

## 2021-05-24 DIAGNOSIS — I10 Essential (primary) hypertension: Secondary | ICD-10-CM | POA: Diagnosis not present

## 2021-05-24 NOTE — Progress Notes (Signed)
° °  S:   PCP: Dr. Margarita Rana  Patient arrives in good spirits. Presents to the clinic for hypertension evaluation, counseling, and management. Patient was referred and last seen by Primary Care Provider on 05/13/2021. His BP at that visit was elevated but he had not taken his antihypertensives that day.   Today, pt denies chest pain, dyspnea, HA or blurred vision. Denies dizziness or presyncope. No lightheadedness.   Medication adherence reported. He has taken them this morning.   Current BP Medications include:  Amlodipine 10 mg daily, Losartan-HCTZ 100-25 mg daily, spironolactone 25 mg daily   Dietary habits include: compliant with salt restriction; drinks coffee in the mornings  Exercise habits include: no formal exercise regimen - works 3 days a week and gets most of his physical activity during work Family / Social history:  - FHx: no pertinent positives - Tobacco: current every day smoker  - Alcohol: drinks "a couple of beers almost every day"   O:  Vitals:   05/24/21 1343  BP: 123/65  Pulse: (!) 55    Home BP readings:  - Brings his log - 12/8: 115/73 - 12/9: 108/62 - 12/11: 99/59 - 12/12: 106/69 - 12/13: 132/79 - 12/14: 112/74   Last 3 Office BP readings: BP Readings from Last 3 Encounters:  05/24/21 123/65  05/13/21 (!) 158/83  03/20/21 139/90   BMET    Component Value Date/Time   NA 139 05/13/2021 0917   K 4.6 05/13/2021 0917   CL 100 05/13/2021 0917   CO2 26 05/13/2021 0917   GLUCOSE 108 (H) 05/13/2021 0917   GLUCOSE 115 (H) 05/17/2020 0806   BUN 14 05/13/2021 0917   CREATININE 1.03 05/13/2021 0917   CALCIUM 9.7 05/13/2021 0917   GFRNONAA 89 06/21/2020 0850   GFRNONAA >60 05/17/2020 0806   GFRAA 103 06/21/2020 0850    Renal function: Estimated Creatinine Clearance: 67.5 mL/min (by C-G formula based on SCr of 1.03 mg/dL).  Clinical ASCVD: No  The ASCVD Risk score (Arnett DK, et al., 2019) failed to calculate for the following reasons:   The valid  HDL cholesterol range is 20 to 100 mg/dL  A/P: Hypertension longstanding currently at goal on current medications. BP Goal = < 130/80 mmHg. Medication adherence reported.  -Continued current regimen.  -Counseled on lifestyle modifications for blood pressure control including reduced dietary sodium, increased exercise, adequate sleep.  Results reviewed and written information provided.   Total time in face-to-face counseling 15 minutes.   F/U Clinic Visit with PCP next month.   Benard Halsted, PharmD, [facility], [place] [phone]

## 2021-06-24 ENCOUNTER — Ambulatory Visit (HOSPITAL_BASED_OUTPATIENT_CLINIC_OR_DEPARTMENT_OTHER)
Admission: RE | Admit: 2021-06-24 | Discharge: 2021-06-24 | Disposition: A | Discharge status: Home / Self Care | Payer: PPO | Source: Ambulatory Visit | Attending: Family Medicine | Admitting: Family Medicine

## 2021-06-24 ENCOUNTER — Ambulatory Visit: Payer: PPO | Admitting: Family Medicine

## 2021-06-24 ENCOUNTER — Other Ambulatory Visit: Payer: Self-pay

## 2021-06-24 VITALS — BP 140/80 | Ht 69.0 in | Wt 147.0 lb

## 2021-06-24 DIAGNOSIS — M48062 Spinal stenosis, lumbar region with neurogenic claudication: Secondary | ICD-10-CM | POA: Diagnosis not present

## 2021-06-24 DIAGNOSIS — M545 Low back pain, unspecified: Secondary | ICD-10-CM | POA: Diagnosis not present

## 2021-06-24 MED ORDER — GABAPENTIN 300 MG PO CAPS: 300.0000 mg | ORAL_CAPSULE | Freq: Three times a day (TID) | ORAL | 1 refills | Status: DC

## 2021-06-24 NOTE — Progress Notes (Signed)
°  Isaac Chan - 66 y.o. male MRN [id number]  Date of birth: 1955-11-14  SUBJECTIVE:  Including CC & ROS.  No chief complaint on file.   Isaac Chan is a 66 y.o. male that is presenting with acute on chronic low back pain with altered sensation in his upper thighs.  Pain has been ongoing for greater than 6 months.  He has been seen by his primary care which started medication.  Pain is been intermittently ongoing during this time.  He has been monitored for greater than 6 weeks of home exercise program by his primary care.  Having more pain and more sensational changes in his upper thighs since the beginning of the new year.  Has been able to work.   Review of note from 10/12 shows he was provided Solu-Medrol with a urinalysis that was negative for UTI.   Review of Systems See HPI   HISTORY: Past Medical, Surgical, Social, and Family History Reviewed & Updated per EMR.   Pertinent Historical Findings include:  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   Dizziness 05/23/2020   Dyspnea on exertion 05/23/2020   Essential hypertension 05/23/2020   Shoulder injury, left, initial encounter 07/02/2017   Smoking 05/23/2020    Past Surgical History:  Procedure Laterality Date   R wrist fx repaired     SHOULDER ARTHROSCOPY Left      PHYSICAL EXAM:  VS: BP 140/80    Ht 5\' 9"  (1.753 m)    Wt 147 lb (66.7 kg)    BMI 21.71 kg/m  Physical Exam Gen: NAD, alert, cooperative with exam, well-appearing MSK: Neurovascularly intact       ASSESSMENT & PLAN:   Spinal stenosis, lumbar region, with neurogenic claudication Has degenerative changes in the lumbar spine is unlikely are contributing as well as spinal stenosis.  Deep tendon reflexes are diminished in the lower extremity.  Has been monitored for over 6 weeks of home exercise therapy by his primary care. -Counseled on home exercise therapy and supportive care. -X-ray. -Gabapentin -MRI of the lumbar spine to  evaluate for spinal stenosis and consideration of epidural.

## 2021-06-24 NOTE — Patient Instructions (Signed)
Nice to meet you Please try the exercises   Please try heat  I will call with the xray results.  Please schedule the MRI downstairs.  Please start the gabapentin at night and then you can increase to 2 or 3 times as you tolerate.  Please send me a message in MyChart with any questions or updates.  We'll setup a virtual visit once the MRI is resulted.   --Dr. Raeford Razor

## 2021-06-24 NOTE — Assessment & Plan Note (Signed)
Has degenerative changes in the lumbar spine is unlikely are contributing as well as spinal stenosis.  Deep tendon reflexes are diminished in the lower extremity.  Has been monitored for over 6 weeks of home exercise therapy by his primary care. -Counseled on home exercise therapy and supportive care. -X-ray. -Gabapentin -MRI of the lumbar spine to evaluate for spinal stenosis and consideration of epidural.

## 2021-06-26 ENCOUNTER — Telehealth: Payer: Self-pay | Admitting: Family Medicine

## 2021-06-26 NOTE — Telephone Encounter (Signed)
Informed of results.   Rosemarie Ax, MD [facility] 06/26/2021, 3:49 PM

## 2021-06-29 ENCOUNTER — Ambulatory Visit (HOSPITAL_BASED_OUTPATIENT_CLINIC_OR_DEPARTMENT_OTHER)
Admission: RE | Admit: 2021-06-29 | Discharge: 2021-06-29 | Disposition: A | Discharge status: Home / Self Care | Payer: PPO | Source: Ambulatory Visit | Attending: Family Medicine | Admitting: Family Medicine

## 2021-06-29 ENCOUNTER — Other Ambulatory Visit: Payer: Self-pay

## 2021-06-29 DIAGNOSIS — M48062 Spinal stenosis, lumbar region with neurogenic claudication: Secondary | ICD-10-CM | POA: Diagnosis not present

## 2021-06-29 DIAGNOSIS — M48061 Spinal stenosis, lumbar region without neurogenic claudication: Secondary | ICD-10-CM | POA: Diagnosis not present

## 2021-06-29 DIAGNOSIS — M47816 Spondylosis without myelopathy or radiculopathy, lumbar region: Secondary | ICD-10-CM | POA: Diagnosis not present

## 2021-07-02 ENCOUNTER — Telehealth (INDEPENDENT_AMBULATORY_CARE_PROVIDER_SITE_OTHER): Payer: PPO | Admitting: Family Medicine

## 2021-07-02 ENCOUNTER — Encounter: Payer: Self-pay | Admitting: Family Medicine

## 2021-07-02 VITALS — Ht 69.0 in | Wt 147.0 lb

## 2021-07-02 DIAGNOSIS — N281 Cyst of kidney, acquired: Secondary | ICD-10-CM | POA: Diagnosis not present

## 2021-07-02 DIAGNOSIS — M48062 Spinal stenosis, lumbar region with neurogenic claudication: Secondary | ICD-10-CM | POA: Diagnosis not present

## 2021-07-02 NOTE — Assessment & Plan Note (Signed)
MRI was confirming severe central stenosis. -Counseled on home exercise therapy and supportive care. -Pursue epidural.

## 2021-07-02 NOTE — Assessment & Plan Note (Signed)
Observed on MRI from most recent lumbar MRI.

## 2021-07-02 NOTE — Progress Notes (Signed)
Virtual Visit via Video Note  I connected with Isaac Chan on 07/02/21 at  1:50 PM EST by a video enabled telemedicine application and verified that I am speaking with the correct person using two identifiers.  Location: Patient: home Provider: office   I discussed the limitations of evaluation and management by telemedicine and the availability of in person appointments. The patient expressed understanding and agreed to proceed.  History of Present Illness:  Mr. Isaac Chan is an 66 year old male that is following up after the MRI of his lumbar spine.  This was demonstrating multiple levels degenerative change with severe central stenosis at L3-4.   Observations/Objective:   Assessment and Plan:  Simple cyst of kidney: Observed on MRI from most recent lumbar MRI.  Spinal stenosis of the lumbar region with neurogenic claudication: MRI was confirming severe central stenosis. -Counseled on home exercise therapy and supportive care. -Pursue epidural.  Follow Up Instructions:    I discussed the assessment and treatment plan with the patient. The patient was provided an opportunity to ask questions and all were answered. The patient agreed with the plan and demonstrated an understanding of the instructions.   The patient was advised to call back or seek an in-person evaluation if the symptoms worsen or if the condition fails to improve as anticipated.    Clearance Coots, MD

## 2021-07-03 ENCOUNTER — Ambulatory Visit
Admission: RE | Admit: 2021-07-03 | Discharge: 2021-07-03 | Disposition: A | Discharge status: Home / Self Care | Payer: PPO | Source: Ambulatory Visit | Attending: Family Medicine | Admitting: Family Medicine

## 2021-07-03 ENCOUNTER — Other Ambulatory Visit: Payer: Self-pay

## 2021-07-03 DIAGNOSIS — M48062 Spinal stenosis, lumbar region with neurogenic claudication: Secondary | ICD-10-CM

## 2021-07-03 DIAGNOSIS — M545 Low back pain, unspecified: Secondary | ICD-10-CM | POA: Diagnosis not present

## 2021-07-03 DIAGNOSIS — M48061 Spinal stenosis, lumbar region without neurogenic claudication: Secondary | ICD-10-CM | POA: Diagnosis not present

## 2021-07-03 MED ORDER — METHYLPREDNISOLONE ACETATE 40 MG/ML INJ SUSP (RADIOLOG
80.0000 mg | Freq: Once | INTRAMUSCULAR | Status: AC
  Administered 2021-07-03: 80 mg via EPIDURAL

## 2021-07-03 MED ORDER — IOPAMIDOL (ISOVUE-M 300) INJECTION 61%
1.0000 mL | Freq: Once | INTRAMUSCULAR | Status: AC | PRN
  Administered 2021-07-03: 1 mL via EPIDURAL

## 2021-07-03 NOTE — Discharge Instructions (Signed)

## 2021-07-30 ENCOUNTER — Other Ambulatory Visit: Payer: Self-pay | Admitting: Cardiology

## 2021-08-12 ENCOUNTER — Ambulatory Visit: Payer: PPO | Attending: Family Medicine | Admitting: Family Medicine

## 2021-08-12 ENCOUNTER — Other Ambulatory Visit: Payer: Self-pay

## 2021-08-12 ENCOUNTER — Encounter: Payer: Self-pay | Admitting: Family Medicine

## 2021-08-12 VITALS — BP 122/80 | HR 56 | Ht 69.0 in | Wt 145.6 lb

## 2021-08-12 DIAGNOSIS — E7800 Pure hypercholesterolemia, unspecified: Secondary | ICD-10-CM

## 2021-08-12 DIAGNOSIS — G8929 Other chronic pain: Secondary | ICD-10-CM

## 2021-08-12 DIAGNOSIS — F17200 Nicotine dependence, unspecified, uncomplicated: Secondary | ICD-10-CM

## 2021-08-12 DIAGNOSIS — M5442 Lumbago with sciatica, left side: Secondary | ICD-10-CM | POA: Diagnosis not present

## 2021-08-12 DIAGNOSIS — M5441 Lumbago with sciatica, right side: Secondary | ICD-10-CM | POA: Diagnosis not present

## 2021-08-12 DIAGNOSIS — I10 Essential (primary) hypertension: Secondary | ICD-10-CM | POA: Diagnosis not present

## 2021-08-12 MED ORDER — GABAPENTIN 300 MG PO CAPS: 300.0000 mg | ORAL_CAPSULE | Freq: Three times a day (TID) | ORAL | 1 refills | Status: DC

## 2021-08-12 MED ORDER — ATORVASTATIN CALCIUM 40 MG PO TABS: 40.0000 mg | ORAL_TABLET | Freq: Every day | ORAL | 1 refills | Status: DC

## 2021-08-12 NOTE — Progress Notes (Signed)
No concerns ?Needs medication refills. ?

## 2021-08-12 NOTE — Patient Instructions (Addendum)
Please call for your colonoscopy appointment : ?[facility]  ?[address] ?[place], [place] [address] ?PH# [phone] ?

## 2021-08-12 NOTE — Progress Notes (Signed)
? ?Subjective:  ?Patient ID: Isaac Chan, male    DOB: 21-Dec-1955  Age: 66 y.o. MRN: [id number] ? ?CC: Hypertension ? ? ?HPI ?Isaac Chan is a 66 y.o. year old male with a history of Tobacco abuse, Hypertension, hyperlipidemia, tobacco abuse ( ciggarrettes on and off about 6 cig/day for 30 years)   smokes THC  here for chronic disease management. ?  ? ?Interval History: ?Endorses compliance with his antihypertensive and his statin and denies presence of chest pain or dyspnea. ?He does have low back pain from spinal stenosis which radiates to his hips and alternates from left to right and is controlled on Gabapentin. Rates it as a 2/10 at the moment. ?States THC helps with his back pain.  Previously seen by sports medicine 2 months ago. ?Past Medical History:  ?Diagnosis Date  ? Bradycardia 05/23/2020  ? Dizziness 05/23/2020  ? Dyspnea on exertion 05/23/2020  ? Essential hypertension 05/23/2020  ? Shoulder injury, left, initial encounter 07/02/2017  ? Smoking 05/23/2020  ? ? ?Past Surgical History:  ?Procedure Laterality Date  ? R wrist fx repaired    ? SHOULDER ARTHROSCOPY Left   ? ? ?No family history on file. ? ?No Known Allergies ? ?Outpatient Medications Prior to Visit  ?Medication Sig Dispense Refill  ? amLODipine (NORVASC) 10 MG tablet TAKE 1 TABLET(10 MG) BY MOUTH DAILY 90 tablet 1  ? atorvastatin (LIPITOR) 40 MG tablet Take 1 tablet (40 mg total) by mouth daily. 30 tablet 6  ? gabapentin (NEURONTIN) 300 MG capsule Take 1 capsule (300 mg total) by mouth 3 (three) times daily. 60 capsule 1  ? losartan-hydrochlorothiazide (HYZAAR) 100-25 MG tablet TAKE 1 TABLET BY MOUTH DAILY 90 tablet 1  ? nicotine (NICODERM CQ) 14 mg/24hr patch Place 1 patch (14 mg total) onto the skin daily. 28 patch 1  ? spironolactone (ALDACTONE) 25 MG tablet TAKE 1 TABLET(25 MG) BY MOUTH DAILY 90 tablet 1  ? ?No facility-administered medications prior to visit.  ? ? ? ?ROS ?Review of Systems  ?Constitutional:  Negative for activity change  and appetite change.  ?HENT:  Negative for sinus pressure and sore throat.   ?Eyes:  Negative for visual disturbance.  ?Respiratory:  Negative for cough, chest tightness and shortness of breath.   ?Cardiovascular:  Negative for chest pain and leg swelling.  ?Gastrointestinal:  Negative for abdominal distention, abdominal pain, constipation and diarrhea.  ?Endocrine: Negative.   ?Genitourinary:  Negative for dysuria.  ?Musculoskeletal:  Positive for back pain. Negative for joint swelling and myalgias.  ?Skin:  Negative for rash.  ?Allergic/Immunologic: Negative.   ?Neurological:  Negative for weakness, light-headedness and numbness.  ?Psychiatric/Behavioral:  Negative for dysphoric mood and suicidal ideas.   ? ?Objective:  ?BP 122/80   Pulse (!) 56   Ht 5' 9"  (1.753 m)   Wt 145 lb 9.6 oz (66 kg)   SpO2 98%   BMI 21.50 kg/m?  ? ?BP/Weight 08/12/2021 07/03/2021 07/02/2021  ?Systolic BP 324 401 -  ?Diastolic BP 80 81 -  ?Wt. (Lbs) 145.6 - 147  ?BMI 21.5 - 21.71  ? ? ? ? ?Physical Exam ?Constitutional:   ?   Appearance: He is well-developed.  ?Cardiovascular:  ?   Rate and Rhythm: Normal rate.  ?   Heart sounds: Normal heart sounds. No murmur heard. ?Pulmonary:  ?   Effort: Pulmonary effort is normal.  ?   Breath sounds: Normal breath sounds. No wheezing or rales.  ?Chest:  ?   Chest wall:  No tenderness.  ?Abdominal:  ?   General: Bowel sounds are normal. There is no distension.  ?   Palpations: Abdomen is soft. There is no mass.  ?   Tenderness: There is no abdominal tenderness.  ?Musculoskeletal:     ?   General: Normal range of motion.  ?   Right lower leg: No edema.  ?   Left lower leg: No edema.  ?   Comments: He ambulates slightly bent forward due to lumbar pain.  ?Neurological:  ?   Mental Status: He is alert and oriented to person, place, and time.  ?Psychiatric:     ?   Mood and Affect: Mood normal.  ? ? ?CMP Latest Ref Rng & Units 05/13/2021 10/08/2020 09/14/2020  ?Glucose 70 - 99 mg/dL 108(H) 111(H) 102(H)  ?BUN 8  - 27 mg/dL 14 26 22   ?Creatinine 0.76 - 1.27 mg/dL 1.03 1.09 1.01  ?Sodium 134 - 144 mmol/L 139 131(L) 137  ?Potassium 3.5 - 5.2 mmol/L 4.6 4.4 4.3  ?Chloride 96 - 106 mmol/L 100 96 98  ?CO2 20 - 29 mmol/L 26 20 20   ?Calcium 8.6 - 10.2 mg/dL 9.7 9.2 9.4  ?Total Protein 6.0 - 8.5 g/dL 7.3 - -  ?Total Bilirubin 0.0 - 1.2 mg/dL 0.3 - -  ?Alkaline Phos 44 - 121 IU/L 50 - -  ?AST 0 - 40 IU/L 29 - -  ?ALT 0 - 44 IU/L 24 - -  ? ? ?Lipid Panel  ?   ?Component Value Date/Time  ? CHOL 235 (H) 05/13/2021 9407  ? TRIG 61 05/13/2021 0917  ? HDL 143 05/13/2021 0917  ? CHOLHDL 2.8 06/21/2020 0850  ? LDLCALC 82 05/13/2021 0917  ? ? ?CBC ?   ?Component Value Date/Time  ? WBC 4.9 05/13/2021 0917  ? WBC 5.0 05/17/2020 0806  ? RBC 4.45 05/13/2021 0917  ? RBC 5.88 (H) 05/17/2020 0806  ? HGB 12.0 (L) 05/13/2021 0917  ? HCT 34.9 (L) 05/13/2021 0917  ? PLT 213 05/13/2021 0917  ? MCV 78 (L) 05/13/2021 0917  ? MCH 27.0 05/13/2021 0917  ? MCH 27.6 05/17/2020 0806  ? MCHC 34.4 05/13/2021 0917  ? MCHC 36.1 (H) 05/17/2020 0806  ? RDW 15.1 05/13/2021 0917  ? LYMPHSABS 1.8 05/13/2021 0917  ? MONOABS 0.5 05/17/2020 0806  ? EOSABS 0.1 05/13/2021 0917  ? BASOSABS 0.0 05/13/2021 0917  ? ? ?No results found for: HGBA1C ? ?Assessment & Plan:  ?1. Pure hypercholesterolemia ?Uncontrolled from last set of labs ?We will check lipid panel at next visit when he is fasting ?Low-cholesterol diet ?- CMP14+EGFR ?- atorvastatin (LIPITOR) 40 MG tablet; Take 1 tablet (40 mg total) by mouth daily.  Dispense: 90 tablet; Refill: 1 ? ?2. Essential hypertension ?Controlled ?Continue antihypertensive ?Counseled on blood pressure goal of less than 130/80, low-sodium, DASH diet, medication compliance, 150 minutes of moderate intensity exercise per week. ?Discussed medication compliance, adverse effects. ? ? ?3. Nicotine dependence with current use ?He does have prescription for patches but is declining prescription for Wellbutrin ?He is not ready to completely quit  smoking ? ?4. Chronic bilateral low back pain with bilateral sciatica ?Ran out of gabapentin which she was previously prescribed by sports medicine which I have refilled. ?Advised to apply heat or ice whichever is tolerated to painful areas. ?Counseled on evidence of improvement in pain control with regards to yoga, water aerobics, massage, home physical therapy, exercise as tolerated. ? ?- gabapentin (NEURONTIN) 300 MG capsule; Take 1  capsule (300 mg total) by mouth 3 (three) times daily.  Dispense: 60 capsule; Refill: 1 ? ? ? ?No orders of the defined types were placed in this encounter. ? ? ?Return in about 6 months (around 02/12/2022) for Chronic medical conditions. ? ? ? ? ? ? ?Charlott Rakes, MD, FAAFP. ?[address] ?[place], [place] ?[phone]   ?08/12/2021, 1:53 PM ?

## 2021-08-13 LAB — CMP14+EGFR
ALT: 26 IU/L (ref 0–44)
AST: 28 IU/L (ref 0–40)
Albumin/Globulin Ratio: 2.4 — ABNORMAL HIGH (ref 1.2–2.2)
Albumin: 5.2 g/dL — ABNORMAL HIGH (ref 3.8–4.8)
Alkaline Phosphatase: 45 IU/L (ref 44–121)
BUN/Creatinine Ratio: 17 (ref 10–24)
BUN: 22 mg/dL (ref 8–27)
Bilirubin Total: 0.3 mg/dL (ref 0.0–1.2)
CO2: 23 mmol/L (ref 20–29)
Calcium: 9.7 mg/dL (ref 8.6–10.2)
Chloride: 102 mmol/L (ref 96–106)
Creatinine, Ser: 1.26 mg/dL (ref 0.76–1.27)
Globulin, Total: 2.2 g/dL (ref 1.5–4.5)
Glucose: 91 mg/dL (ref 70–99)
Potassium: 4.5 mmol/L (ref 3.5–5.2)
Sodium: 139 mmol/L (ref 134–144)
Total Protein: 7.4 g/dL (ref 6.0–8.5)
eGFR: 63 mL/min/{1.73_m2} (ref >59)

## 2021-08-26 ENCOUNTER — Encounter: Payer: Self-pay | Admitting: Gastroenterology

## 2021-09-06 ENCOUNTER — Ambulatory Visit (AMBULATORY_SURGERY_CENTER): Payer: PPO | Admitting: *Deleted

## 2021-09-06 VITALS — Ht 69.0 in | Wt 146.0 lb

## 2021-09-06 DIAGNOSIS — Z1211 Encounter for screening for malignant neoplasm of colon: Secondary | ICD-10-CM

## 2021-09-06 NOTE — Progress Notes (Signed)

## 2021-09-09 ENCOUNTER — Other Ambulatory Visit: Payer: Self-pay

## 2021-09-09 ENCOUNTER — Emergency Department (HOSPITAL_BASED_OUTPATIENT_CLINIC_OR_DEPARTMENT_OTHER)
Admission: EM | Admit: 2021-09-09 | Discharge: 2021-09-09 | Disposition: A | Discharge status: Home / Self Care | Payer: PPO | Attending: Emergency Medicine | Admitting: Emergency Medicine

## 2021-09-09 ENCOUNTER — Encounter (HOSPITAL_BASED_OUTPATIENT_CLINIC_OR_DEPARTMENT_OTHER): Payer: Self-pay

## 2021-09-09 DIAGNOSIS — M5459 Other low back pain: Secondary | ICD-10-CM | POA: Diagnosis not present

## 2021-09-09 DIAGNOSIS — Z79899 Other long term (current) drug therapy: Secondary | ICD-10-CM | POA: Diagnosis not present

## 2021-09-09 DIAGNOSIS — G8929 Other chronic pain: Secondary | ICD-10-CM | POA: Insufficient documentation

## 2021-09-09 DIAGNOSIS — M545 Low back pain, unspecified: Secondary | ICD-10-CM | POA: Diagnosis not present

## 2021-09-09 MED ORDER — TIZANIDINE HCL 4 MG PO TABS: 4.0000 mg | ORAL_TABLET | Freq: Four times a day (QID) | ORAL | 0 refills | Status: DC | PRN

## 2021-09-09 MED ORDER — OXYCODONE-ACETAMINOPHEN 5-325 MG PO TABS: 1.0000 | ORAL_TABLET | Freq: Four times a day (QID) | ORAL | 0 refills | Status: DC | PRN

## 2021-09-09 MED ORDER — ONDANSETRON 4 MG PO TBDP
4.0000 mg | ORAL_TABLET | Freq: Once | ORAL | Status: AC
  Administered 2021-09-09: 4 mg via ORAL
  Filled 2021-09-09: qty 1

## 2021-09-09 MED ORDER — HYDROMORPHONE HCL 1 MG/ML IJ SOLN
1.0000 mg | Freq: Once | INTRAMUSCULAR | Status: AC
  Administered 2021-09-09: 1 mg via INTRAMUSCULAR
  Filled 2021-09-09: qty 1

## 2021-09-09 MED ORDER — IBUPROFEN 200 MG PO TABS
600.0000 mg | ORAL_TABLET | Freq: Once | ORAL | Status: AC
Start: 2021-09-09 — End: 2021-09-09
  Administered 2021-09-09: 600 mg via ORAL
  Filled 2021-09-09: qty 1

## 2021-09-09 NOTE — ED Triage Notes (Signed)
Pt arrives ambulatory but hunched over c/o back pain starting Saturday morning, denies any injury. Pain is left sided. Pt has been using heat and ice with no relief. Denies any urinary symptoms. Some radiation into left hip ?

## 2021-09-09 NOTE — Discharge Instructions (Signed)
Call your primary care doctor or specialist as discussed in the next 2-3 days.   Return immediately back to the ER if:  Your symptoms worsen within the next 12-24 hours. You develop new symptoms such as new fevers, persistent vomiting, new pain, shortness of breath, or new weakness or numbness, or if you have any other concerns.  

## 2021-09-09 NOTE — ED Provider Notes (Signed)
?[facility] ?Provider Note ? ? ?CSN: [id number] ?Arrival date & time: 09/09/21  1150 ? ?  ? ?History ? ?Chief Complaint  ?Patient presents with  ? Back Pain  ? ? ?Isaac Chan is a 66 y.o. male. ? ?Left lower back pain.  He has a history of lower back pain for several months.  He is seen sports medicine physicians and had injections in his back in the past in January.  He woke up about 3 days ago with worsening pain in the left lower back.  Does not radiate down to his legs.  Certain movements make it worse such as when he gets out of bed or tries to bend over.  Denies any fevers or cough or vomiting or diarrhea.  Denies any unintentional weight loss.  Denies any new numbness or weakness or bowel or bladder dysfunction.  Taking Tylenol and Motrin at home without improvement.  Lidocaine patch used at home without improvement. ? ? ?  ? ?Home Medications ?Prior to Admission medications   ?Medication Sig Start Date End Date Taking? Authorizing Provider  ?oxyCODONE-acetaminophen (PERCOCET/ROXICET) 5-325 MG tablet Take 1 tablet by mouth every 6 (six) hours as needed for severe pain. 09/09/21  Yes Sarahelizabeth Conway, Greggory Brandy, MD  ?tiZANidine (ZANAFLEX) 4 MG tablet Take 1 tablet (4 mg total) by mouth every 6 (six) hours as needed for up to 15 doses for muscle spasms. 09/09/21  Yes Luna Fuse, MD  ?amLODipine (NORVASC) 10 MG tablet TAKE 1 TABLET(10 MG) BY MOUTH DAILY 05/13/21   Charlott Rakes, MD  ?atorvastatin (LIPITOR) 40 MG tablet Take 1 tablet (40 mg total) by mouth daily. 08/12/21   Charlott Rakes, MD  ?gabapentin (NEURONTIN) 300 MG capsule Take 1 capsule (300 mg total) by mouth 3 (three) times daily. 08/12/21   Charlott Rakes, MD  ?losartan-hydrochlorothiazide (HYZAAR) 100-25 MG tablet TAKE 1 TABLET BY MOUTH DAILY 04/22/21   Charlott Rakes, MD  ?nicotine (NICODERM CQ) 14 mg/24hr patch Place 1 patch (14 mg total) onto the skin daily. ?Patient not taking: Reported on 09/06/2021 05/13/21   Charlott Rakes, MD   ?spironolactone (ALDACTONE) 25 MG tablet TAKE 1 TABLET(25 MG) BY MOUTH DAILY ?Patient not taking: Reported on 09/06/2021 07/30/21   Park Liter, MD  ?   ? ?Allergies    ?Patient has no known allergies.   ? ?Review of Systems   ?Review of Systems  ?Constitutional:  Negative for fever.  ?HENT:  Negative for ear pain and sore throat.   ?Eyes:  Negative for pain.  ?Respiratory:  Negative for cough.   ?Cardiovascular:  Negative for chest pain.  ?Gastrointestinal:  Negative for abdominal pain.  ?Genitourinary:  Negative for flank pain.  ?Musculoskeletal:  Positive for back pain.  ?Skin:  Negative for color change and rash.  ?Neurological:  Negative for syncope.  ?All other systems reviewed and are negative. ? ?Physical Exam ?Updated Vital Signs ?BP 128/83   Pulse (!) 51   Temp 98.2 ?F (36.8 ?C)   Resp 18   Ht '5\' 9"'$  (1.753 m)   Wt 66.2 kg   SpO2 100%   BMI 21.56 kg/m?  ?Physical Exam ?Constitutional:   ?   Appearance: He is well-developed.  ?HENT:  ?   Head: Normocephalic.  ?   Nose: Nose normal.  ?Eyes:  ?   Extraocular Movements: Extraocular movements intact.  ?Cardiovascular:  ?   Rate and Rhythm: Normal rate.  ?Pulmonary:  ?   Effort: Pulmonary effort is  normal.  ?Musculoskeletal:  ?   Comments: No C or T or L-spine midline step-offs.  L4-5 left paraspinal tenderness present.  Negative straight leg test bilaterally.  Normal perfusion bilateral lower extremities dorsalis pedis pulses are 2+ and normal.  Sensation intact bilateral lower extremities.  ?Skin: ?   Coloration: Skin is not jaundiced.  ?Neurological:  ?   General: No focal deficit present.  ?   Mental Status: He is alert and oriented to person, place, and time. Mental status is at baseline.  ?   Cranial Nerves: No cranial nerve deficit.  ?   Motor: No weakness.  ? ? ?ED Results / Procedures / Treatments   ?Labs ?(all labs ordered are listed, but only abnormal results are displayed) ?Labs Reviewed - No data to display ? ?EKG ?None ? ?Radiology ?No  results found. ? ?Procedures ?Procedures  ? ? ?Medications Ordered in ED ?Medications  ?HYDROmorphone (DILAUDID) injection 1 mg (1 mg Intramuscular Given 09/09/21 1542)  ?ondansetron (ZOFRAN-ODT) disintegrating tablet 4 mg (4 mg Oral Given 09/09/21 1541)  ?ibuprofen (ADVIL) tablet 600 mg (600 mg Oral Given 09/09/21 1540)  ? ? ?ED Course/ Medical Decision Making/ A&P ?  ?                        ?Medical Decision Making ?Risk ?Prescription drug management. ? ? ?Chart review shows outpatient visit January 2023 with spine and muscle skeletal specialist. ? ?Cardiac monitor shows sinus rhythm mild bradycardic rate. ? ?No additional diagnostic test ordered here as the patient has no focal neurodeficit no new numbness or weakness no bowel or bladder dysfunction and no fevers. ? ?Given Dilaudid and oral medication with significant improvement in symptoms.  Given a prescription of narcotic medication to go home with.  Advise follow-up with his back specialist this week.  Advised immediate return for worsening symptoms new numbness weakness or any additional concerns. ? ? ? ? ? ? ? ?Final Clinical Impression(s) / ED Diagnoses ?Final diagnoses:  ?Chronic left-sided low back pain without sciatica  ? ? ?Rx / DC Orders ?ED Discharge Orders   ? ?      Ordered  ?  tiZANidine (ZANAFLEX) 4 MG tablet  Every 6 hours PRN       ? 09/09/21 1613  ?  oxyCODONE-acetaminophen (PERCOCET/ROXICET) 5-325 MG tablet  Every 6 hours PRN       ? 09/09/21 1613  ? ?  ?  ? ?  ? ? ?  ?Luna Fuse, MD ?09/09/21 1613 ? ?

## 2021-09-09 NOTE — ED Notes (Signed)
ED Provider at bedside. 

## 2021-09-09 NOTE — ED Notes (Signed)
Pt. Reports he started having pain in the L side to L back on Sat. Morning and if he moves the pain gets worse.  More of the pain is in the muscle area not the spine itself.  Pt. Has a lidocaine patch over the area of concern.  Pt. Has had pain and concern in the same area in the past. ?

## 2021-09-25 ENCOUNTER — Encounter: Payer: Self-pay | Admitting: Gastroenterology

## 2021-09-26 ENCOUNTER — Encounter: Payer: Self-pay | Admitting: Family Medicine

## 2021-09-26 ENCOUNTER — Ambulatory Visit: Payer: PPO | Admitting: Family Medicine

## 2021-09-26 VITALS — BP 130/68 | Ht 69.0 in | Wt 146.0 lb

## 2021-09-26 DIAGNOSIS — M47817 Spondylosis without myelopathy or radiculopathy, lumbosacral region: Secondary | ICD-10-CM | POA: Diagnosis not present

## 2021-09-26 NOTE — Assessment & Plan Note (Signed)
Acutely occurring.  Severe in nature.  Previous MRI did demonstrate facet arthropathy on the left side which could be contributing to his symptoms.  Seems less likely to be associated with his spinal stenosis. ?-Counseled on home exercise therapy and supportive care. ?-Facet injection on the left at L3-4, L4-5, and L5-S1. ?-Could consider physical therapy. ?

## 2021-09-26 NOTE — Progress Notes (Signed)
?  Isaac Chan - 66 y.o. male MRN [id number]  Date of birth: [birth year] ? ?SUBJECTIVE:  Including CC & ROS.  ?No chief complaint on file. ? ? ?Isaac Chan is a 66 y.o. male that is presenting with acute left-sided low back pain.  The pain has been ongoing for several weeks.  It is severe in nature.  He has been out of work for 3 weeks.  The pain is localized to the left lower part of the back.  Previous MRI has demonstrated facet arthropathy on the left side.  No improvement with oxycodone and tizanidine.  No improvement with the brace and supportive measures. ? ?Review of the emergency department note from 4/3 shows he was provided Zanaflex and Percocet. ? ? ?Review of Systems ?See HPI  ? ?HISTORY: Past Medical, Surgical, Social, and Family History Reviewed & Updated per EMR.   ?Pertinent Historical Findings include: ? ?Past Medical History:  ?Diagnosis Date  ? Bradycardia 05/23/2020  ? Dizziness 05/23/2020  ? Dyspnea on exertion 05/23/2020  ? Essential hypertension 05/23/2020  ? Hyperlipidemia   ? Shoulder injury, left, initial encounter 07/02/2017  ? Smoking 05/23/2020  ? ? ?Past Surgical History:  ?Procedure Laterality Date  ? R wrist fx repaired    ? SHOULDER ARTHROSCOPY Left   ? ? ? ?PHYSICAL EXAM:  ?VS: BP 130/68 (BP Location: Left Arm, Patient Position: Sitting)   Ht '5\' 9"'$  (1.753 m)   Wt 146 lb (66.2 kg)   BMI 21.56 kg/m?  ?Physical Exam ?Gen: NAD, alert, cooperative with exam, well-appearing ?MSK:  ?Neurovascularly intact   ? ? ? ? ?ASSESSMENT & PLAN:  ? ?Facet arthropathy, lumbosacral ?Acutely occurring.  Severe in nature.  Previous MRI did demonstrate facet arthropathy on the left side which could be contributing to his symptoms.  Seems less likely to be associated with his spinal stenosis. ?-Counseled on home exercise therapy and supportive care. ?-Facet injection on the left at L3-4, L4-5, and L5-S1. ?-Could consider physical therapy. ? ? ? ? ?

## 2021-09-26 NOTE — Patient Instructions (Signed)
Good to see you ?Please continue brace as needed  ?Please use heat as needed  ?We'll get the facet injections at  imaging   ?Please send me a message in MyChart with any questions or updates.  ?Please call me one week after the injections.  ? ?--Dr. Raeford Razor ? ?

## 2021-09-30 ENCOUNTER — Other Ambulatory Visit: Payer: Self-pay | Admitting: Family Medicine

## 2021-09-30 DIAGNOSIS — M47817 Spondylosis without myelopathy or radiculopathy, lumbosacral region: Secondary | ICD-10-CM

## 2021-10-04 ENCOUNTER — Encounter: Payer: Self-pay | Admitting: Gastroenterology

## 2021-10-04 ENCOUNTER — Ambulatory Visit (AMBULATORY_SURGERY_CENTER): Payer: PPO | Admitting: Gastroenterology

## 2021-10-04 ENCOUNTER — Telehealth: Payer: Self-pay

## 2021-10-04 VITALS — BP 105/54 | HR 53 | Temp 98.2°F | Resp 17 | Ht 69.0 in | Wt 146.0 lb

## 2021-10-04 DIAGNOSIS — D120 Benign neoplasm of cecum: Secondary | ICD-10-CM

## 2021-10-04 DIAGNOSIS — Z1211 Encounter for screening for malignant neoplasm of colon: Secondary | ICD-10-CM

## 2021-10-04 DIAGNOSIS — I10 Essential (primary) hypertension: Secondary | ICD-10-CM | POA: Diagnosis not present

## 2021-10-04 DIAGNOSIS — D124 Benign neoplasm of descending colon: Secondary | ICD-10-CM | POA: Diagnosis not present

## 2021-10-04 DIAGNOSIS — Z125 Encounter for screening for malignant neoplasm of prostate: Secondary | ICD-10-CM

## 2021-10-04 DIAGNOSIS — K640 First degree hemorrhoids: Secondary | ICD-10-CM

## 2021-10-04 DIAGNOSIS — D125 Benign neoplasm of sigmoid colon: Secondary | ICD-10-CM | POA: Diagnosis not present

## 2021-10-04 DIAGNOSIS — Z86010 Personal history of colonic polyps: Secondary | ICD-10-CM | POA: Diagnosis not present

## 2021-10-04 MED ORDER — SODIUM CHLORIDE 0.9 % IV SOLN: 500.0000 mL | Freq: Once | INTRAVENOUS | Status: DC

## 2021-10-04 NOTE — Patient Instructions (Signed)

## 2021-10-04 NOTE — Op Note (Signed)
[facility] ?Patient Name: Isaac Chan ?Procedure Date: 10/04/2021 7:23 AM ?MRN: [id number] ?Endoscopist: Gerrit Heck , MD ?Age: 66 ?Referring MD:  ?Date of Birth: [birth year] ?Gender: Male ?Account #: [id number] ?Procedure:                Colonoscopy ?Indications:              High risk colon cancer surveillance: Personal  ?                          history of colonic polyps on index colonoscopy,  ?                          then none on last colonsocopy. All done at outside  ?                          facility. Unsure of polyp size, location, number,  ?                          or histology. Otherwise, no active GI symptoms. ?Medicines:                Monitored Anesthesia Care ?Procedure:                Pre-Anesthesia Assessment: ?                          - Prior to the procedure, a History and Physical  ?                          was performed, and patient medications and  ?                          allergies were reviewed. The patient's tolerance of  ?                          previous anesthesia was also reviewed. The risks  ?                          and benefits of the procedure and the sedation  ?                          options and risks were discussed with the patient.  ?                          All questions were answered, and informed consent  ?                          was obtained. Prior Anticoagulants: The patient has  ?                          taken no previous anticoagulant or antiplatelet  ?                          agents. ASA Grade Assessment: II - A patient with  ?  mild systemic disease. After reviewing the risks  ?                          and benefits, the patient was deemed in  ?                          satisfactory condition to undergo the procedure. ?                          After obtaining informed consent, the colonoscope  ?                          was passed under direct vision. Throughout the  ?                          procedure, the  patient's blood pressure, pulse, and  ?                          oxygen saturations were monitored continuously. The  ?                          CF [id number] #[id number] was introduced through the anus  ?                          and advanced to the the cecum, identified by  ?                          appendiceal orifice and ileocecal valve. The  ?                          colonoscopy was performed without difficulty. The  ?                          patient tolerated the procedure well. The quality  ?                          of the bowel preparation was excellent. The  ?                          ileocecal valve, appendiceal orifice, and rectum  ?                          were photographed. ?Scope In: 7:57:14 AM ?Scope Out: 8:15:52 AM ?Scope Withdrawal Time: 0 hours 16 minutes 10 seconds  ?Total Procedure Duration: 0 hours 18 minutes 38 seconds  ?Findings:                 Skin tags were found on perianal exam. ?                          A 6 mm polyp was found in the cecum. The polyp was  ?                          sessile. The polyp was removed with a cold snare.  ?  Resection and retrieval were complete. Estimated  ?                          blood loss was minimal. ?                          Three sessile polyps were found in the sigmoid  ?                          colon (2) and descending colon (1). The polyps were  ?                          3 to 8 mm in size. These polyps were removed with a  ?                          cold snare. Resection and retrieval were complete.  ?                          Estimated blood loss was minimal. ?                          Non-bleeding internal hemorrhoids were found during  ?                          retroflexion. The hemorrhoids were small. ?Complications:            No immediate complications. ?Estimated Blood Loss:     Estimated blood loss was minimal. ?Impression:               - Perianal skin tags found on perianal exam. ?                          - One 6  mm polyp in the cecum, removed with a cold  ?                          snare. Resected and retrieved. ?                          - Three 3 to 8 mm polyps in the sigmoid colon and  ?                          in the descending colon, removed with a cold snare.  ?                          Resected and retrieved. ?                          - Non-bleeding internal hemorrhoids. ?Recommendation:           - Patient has a contact number available for  ?                          emergencies. The signs and symptoms of potential  ?  delayed complications were discussed with the  ?                          patient. Return to normal activities tomorrow.  ?                          Written discharge instructions were provided to the  ?                          patient. ?                          - Resume previous diet. ?                          - Continue present medications. ?                          - Await pathology results. ?                          - Repeat colonoscopy for surveillance based on  ?                          pathology results. ?                          - Return to GI clinic PRN. ?Gerrit Heck, MD ?10/04/2021 8:20:47 AM ?

## 2021-10-04 NOTE — Progress Notes (Signed)
Called to room to assist during endoscopic procedure.  Patient ID and intended procedure confirmed with present staff. Received instructions for my participation in the procedure from the performing physician.  

## 2021-10-04 NOTE — Progress Notes (Signed)
Pt's states no medical or surgical changes since previsit or office visit. 

## 2021-10-04 NOTE — Telephone Encounter (Signed)
Pt is here at [facility] for his colonoscopy today, he asked me to check with your office if he has had a PSA blood test recently as his father had passed away from prostate ca, can you please let him know his last result and/or if he is due to have this checked at this time? Thank you! ?

## 2021-10-04 NOTE — Progress Notes (Signed)
? ?GASTROENTEROLOGY PROCEDURE H&P NOTE  ? ?Primary Care Physician: ?Charlott Rakes, MD ? ? ? ?Reason for Procedure:  Colon Cancer screening ? ?Plan:    Colonoscopy ? ?Patient is appropriate for endoscopic procedure(s) in the ambulatory ([facility]) setting. ? ?The nature of the procedure, as well as the risks, benefits, and alternatives were carefully and thoroughly reviewed with the patient. Ample time for discussion and questions allowed. The patient understood, was satisfied, and agreed to proceed.  ? ? ? ?HPI: ?Isaac Chan is a 66 y.o. male who presents for colonoscopy for routine Colon Cancer screening.  No active GI symptoms.  ? ?Past Medical History:  ?Diagnosis Date  ? Bradycardia 05/23/2020  ? Dizziness 05/23/2020  ? Dyspnea on exertion 05/23/2020  ? Essential hypertension 05/23/2020  ? Hyperlipidemia   ? Shoulder injury, left, initial encounter 07/02/2017  ? Smoking 05/23/2020  ? ? ?Past Surgical History:  ?Procedure Laterality Date  ? COLONOSCOPY    ? R wrist fx repaired    ? SHOULDER ARTHROSCOPY Left   ? ? ?Prior to Admission medications   ?Medication Sig Start Date End Date Taking? Authorizing Provider  ?amLODipine (NORVASC) 10 MG tablet TAKE 1 TABLET(10 MG) BY MOUTH DAILY 05/13/21  Yes Charlott Rakes, MD  ?atorvastatin (LIPITOR) 40 MG tablet Take 1 tablet (40 mg total) by mouth daily. 08/12/21  Yes Charlott Rakes, MD  ?gabapentin (NEURONTIN) 300 MG capsule Take 1 capsule (300 mg total) by mouth 3 (three) times daily. 08/12/21  Yes Charlott Rakes, MD  ?losartan-hydrochlorothiazide (HYZAAR) 100-25 MG tablet TAKE 1 TABLET BY MOUTH DAILY 04/22/21  Yes Charlott Rakes, MD  ?spironolactone (ALDACTONE) 25 MG tablet TAKE 1 TABLET(25 MG) BY MOUTH DAILY 07/30/21  Yes Park Liter, MD  ?oxyCODONE-acetaminophen (PERCOCET/ROXICET) 5-325 MG tablet Take 1 tablet by mouth every 6 (six) hours as needed for severe pain. 09/09/21   Luna Fuse, MD  ?tiZANidine (ZANAFLEX) 4 MG tablet Take 1 tablet (4 mg total) by mouth  every 6 (six) hours as needed for up to 15 doses for muscle spasms. 09/09/21   Luna Fuse, MD  ? ? ?Current Outpatient Medications  ?Medication Sig Dispense Refill  ? amLODipine (NORVASC) 10 MG tablet TAKE 1 TABLET(10 MG) BY MOUTH DAILY 90 tablet 1  ? atorvastatin (LIPITOR) 40 MG tablet Take 1 tablet (40 mg total) by mouth daily. 90 tablet 1  ? gabapentin (NEURONTIN) 300 MG capsule Take 1 capsule (300 mg total) by mouth 3 (three) times daily. 60 capsule 1  ? losartan-hydrochlorothiazide (HYZAAR) 100-25 MG tablet TAKE 1 TABLET BY MOUTH DAILY 90 tablet 1  ? spironolactone (ALDACTONE) 25 MG tablet TAKE 1 TABLET(25 MG) BY MOUTH DAILY 90 tablet 1  ? oxyCODONE-acetaminophen (PERCOCET/ROXICET) 5-325 MG tablet Take 1 tablet by mouth every 6 (six) hours as needed for severe pain. 15 tablet 0  ? tiZANidine (ZANAFLEX) 4 MG tablet Take 1 tablet (4 mg total) by mouth every 6 (six) hours as needed for up to 15 doses for muscle spasms. 15 tablet 0  ? ?Current Facility-Administered Medications  ?Medication Dose Route Frequency Provider Last Rate Last Admin  ? 0.9 %  sodium chloride infusion  500 mL Intravenous Once Dennies Coate V, DO      ? ? ?Allergies as of 10/04/2021  ? (No Known Allergies)  ? ? ?Family History  ?Problem Relation Age of Onset  ? Prostate cancer Father   ? Colon cancer Neg Hx   ? Colon polyps Neg Hx   ? Esophageal  cancer Neg Hx   ? Stomach cancer Neg Hx   ? Rectal cancer Neg Hx   ? ? ?Social History  ? ?Socioeconomic History  ? Marital status: Married  ?  Spouse name: Not on file  ? Number of children: Not on file  ? Years of education: Not on file  ? Highest education level: Not on file  ?Occupational History  ? Not on file  ?Tobacco Use  ? Smoking status: Former  ?  Packs/day: 0.25  ?  Years: 46.00  ?  Pack years: 11.50  ?  Types: Cigarettes  ?  Quit date: 07/09/2021  ?  Years since quitting: 0.2  ? Smokeless tobacco: Never  ?Vaping Use  ? Vaping Use: Never used  ?Substance and Sexual Activity  ? Alcohol  use: Yes  ?  Comment: once day-beer  ? Drug use: Yes  ?  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 Sexual activity: Yes  ?Other Topics Concern  ? Not on file  ?Social History Narrative  ? Not on file  ? ?Social Determinants of Health  ? ?Financial Resource Strain: Low Risk   ? Difficulty of Paying Living Expenses: Not hard at all  ?Food Insecurity: No Food Insecurity  ? Worried About Charity fundraiser in the Last Year: Never true  ? Ran Out of Food in the Last Year: Never true  ?Transportation Needs: No Transportation Needs  ? Lack of Transportation (Medical): No  ? Lack of Transportation (Non-Medical): No  ?Physical Activity: Inactive  ? Days of Exercise per Week: 0 days  ? Minutes of Exercise per Session: 0 min  ?Stress: No Stress Concern Present  ? Feeling of Stress : Not at all  ?Social Connections: Socially Integrated  ? Frequency of Communication with Friends and Family: More than three times a week  ? Frequency of Social Gatherings with Friends and Family: More than three times a week  ? Attends Religious Services: More than 4 times per year  ? Active Member of Clubs or Organizations: Yes  ? Attends Archivist Meetings: More than 4 times per year  ? Marital Status: Married  ?Intimate Partner Violence: Not At Risk  ? Fear of Current or Ex-Partner: No  ? Emotionally Abused: No  ? Physically Abused: No  ? Sexually Abused: No  ? ? ?Physical Exam: ?Vital signs in last 24 hours: ?'@BP'$  (!) 110/54   Pulse 68   Temp 98.2 ?F (36.8 ?C) (Temporal)   Ht '5\' 9"'$  (1.753 m)   Wt 146 lb (66.2 kg)   SpO2 100%   BMI 21.56 kg/m?  ?GEN: NAD ?EYE: Sclerae anicteric ?ENT: MMM ?CV: Non-tachycardic ?Pulm: CTA b/l ?GI: Soft, NT/ND ?NEURO:  Alert & Oriented x 3 ? ? ?Gerrit Heck, DO ?[facility] ? ? ?10/04/2021 7:49 AM ? ?

## 2021-10-04 NOTE — Telephone Encounter (Signed)
Order is in the computer and he can stop by the clinic at his convenience to have his PSA checked. ?

## 2021-10-04 NOTE — Progress Notes (Signed)
Report to PACU, RN, vss, BBS= Clear.  

## 2021-10-08 ENCOUNTER — Other Ambulatory Visit: Payer: Self-pay | Admitting: Family Medicine

## 2021-10-08 ENCOUNTER — Telehealth: Payer: Self-pay

## 2021-10-08 ENCOUNTER — Ambulatory Visit
Admission: RE | Admit: 2021-10-08 | Discharge: 2021-10-08 | Disposition: A | Discharge status: Home / Self Care | Payer: PPO | Source: Ambulatory Visit | Attending: Family Medicine | Admitting: Family Medicine

## 2021-10-08 DIAGNOSIS — M545 Low back pain, unspecified: Secondary | ICD-10-CM | POA: Diagnosis not present

## 2021-10-08 DIAGNOSIS — M47817 Spondylosis without myelopathy or radiculopathy, lumbosacral region: Secondary | ICD-10-CM

## 2021-10-08 MED ORDER — IOPAMIDOL (ISOVUE-M 200) INJECTION 41%
1.0000 mL | Freq: Once | INTRAMUSCULAR | Status: AC
  Administered 2021-10-08: 1 mL via INTRA_ARTICULAR

## 2021-10-08 MED ORDER — METHYLPREDNISOLONE ACETATE 40 MG/ML INJ SUSP (RADIOLOG
100.0000 mg | Freq: Once | INTRAMUSCULAR | Status: AC
  Administered 2021-10-08: 100 mg via INTRA_ARTICULAR

## 2021-10-08 NOTE — Telephone Encounter (Signed)
?  Follow up Call- ? ? ?  10/04/2021  ?  7:25 AM  ?Call back number  ?Post procedure Call Back phone  # [phone]  ?Permission to leave phone message Yes  ?  ? ?Patient questions: ? ?Do you have a fever, pain , or abdominal swelling? No. ?Pain Score  0 * ? ?Have you tolerated food without any problems? Yes.   ? ?Have you been able to return to your normal activities? Yes.   ? ?Do you have any questions about your discharge instructions: ?Diet   No. ?Medications  No. ?Follow up visit  No. ? ?Do you have questions or concerns about your Care? No. ? ?Actions: ?* If pain score is 4 or above: ?No action needed, pain <4. ? ? ?

## 2021-10-08 NOTE — Discharge Instructions (Signed)

## 2021-10-15 ENCOUNTER — Telehealth: Payer: Self-pay | Admitting: Family Medicine

## 2021-10-15 NOTE — Telephone Encounter (Signed)
Patient has been called and scheduled a lab appointment ?

## 2021-10-15 NOTE — Telephone Encounter (Signed)
Patient checking on the status of orders to check his prostate, patient would like a follow up call.  ?

## 2021-10-16 ENCOUNTER — Encounter: Payer: Self-pay | Admitting: Gastroenterology

## 2021-10-16 ENCOUNTER — Other Ambulatory Visit: Payer: Self-pay | Admitting: Family Medicine

## 2021-10-16 ENCOUNTER — Ambulatory Visit: Payer: PPO | Attending: Family Medicine

## 2021-10-16 DIAGNOSIS — E7800 Pure hypercholesterolemia, unspecified: Secondary | ICD-10-CM

## 2021-10-16 DIAGNOSIS — Z125 Encounter for screening for malignant neoplasm of prostate: Secondary | ICD-10-CM

## 2021-10-17 LAB — LP+NON-HDL CHOLESTEROL
Cholesterol, Total: 175 mg/dL (ref 100–199)
HDL: 99 mg/dL (ref >39)
LDL Chol Calc (NIH): 64 mg/dL (ref 0–99)
Total Non-HDL-Chol (LDL+VLDL): 76 mg/dL (ref 0–129)
Triglycerides: 59 mg/dL (ref 0–149)
VLDL Cholesterol Cal: 12 mg/dL (ref 5–40)

## 2021-10-17 LAB — PSA, TOTAL AND FREE
PSA, Free Pct: 21.4 %
PSA, Free: 0.3 ng/mL
Prostate Specific Ag, Serum: 1.4 ng/mL (ref 0.0–4.0)

## 2021-10-28 ENCOUNTER — Other Ambulatory Visit: Payer: Self-pay | Admitting: Family Medicine

## 2021-10-28 DIAGNOSIS — I10 Essential (primary) hypertension: Secondary | ICD-10-CM

## 2021-10-29 NOTE — Telephone Encounter (Signed)
Requested Prescriptions  Pending Prescriptions Disp Refills  . losartan-hydrochlorothiazide (HYZAAR) 100-25 MG tablet [Pharmacy Med Name: LOSARTAN/HCTZ 100/25MG TABLETS] 90 tablet 1    Sig: TAKE 1 TABLET BY MOUTH DAILY     Cardiovascular: ARB + Diuretic Combos Failed - 10/28/2021  7:11 AM      Failed - Last BP in normal range    BP Readings from Last 1 Encounters:  10/08/21 (!) 162/83         Passed - K in normal range and within 180 days    Potassium  Date Value Ref Range Status  08/12/2021 4.5 3.5 - 5.2 mmol/L Final         Passed - Na in normal range and within 180 days    Sodium  Date Value Ref Range Status  08/12/2021 139 134 - 144 mmol/L Final         Passed - Cr in normal range and within 180 days    Creatinine, Ser  Date Value Ref Range Status  08/12/2021 1.26 0.76 - 1.27 mg/dL Final         Passed - eGFR is 10 or above and within 180 days    GFR calc Af Amer  Date Value Ref Range Status  06/21/2020 103 >59 mL/min/1.73 Final    Comment:    **In accordance with recommendations from the NKF-ASN Task force,**   [facility] is in the process of updating its eGFR calculation to the   2021 CKD-EPI creatinine equation that estimates kidney function   without a race variable.    GFR, Estimated  Date Value Ref Range Status  05/17/2020 >60 >60 mL/min Final    Comment:    (NOTE) Calculated using the CKD-EPI Creatinine Equation (2021)    GFR calc non Af Amer  Date Value Ref Range Status  06/21/2020 89 >59 mL/min/1.73 Final   eGFR  Date Value Ref Range Status  08/12/2021 63 >59 mL/min/1.73 Final         Passed - Patient is not pregnant      Passed - Valid encounter within last 6 months    Recent Outpatient Visits          2 months ago Essential hypertension   [place], Charlane Ferretti, MD   5 months ago Essential hypertension   [place], Jarome Matin, RPH-CPP   5 months ago Essential  hypertension   [place], [place], MD   7 months ago Acute right-sided low back pain with right-sided sciatica   [place] Mayers, Cari S, [place]   11 months ago Pure hypercholesterolemia   [place], [place], MD             . amLODipine (NORVASC) 10 MG tablet [Pharmacy Med Name: AMLODIPINE BESYLATE 10MG  TABLETS] 90 tablet 1    Sig: TAKE 1 TABLET(10 MG) BY MOUTH DAILY     Cardiovascular: Calcium Channel Blockers 2 Failed - 10/28/2021  7:11 AM      Failed - Last BP in normal range    BP Readings from Last 1 Encounters:  10/08/21 (!) 162/83         Passed - Last Heart Rate in normal range    Pulse Readings from Last 1 Encounters:  10/08/21 (!) 50         Passed - Valid encounter within last 6 months  Recent Outpatient Visits          2 months ago Essential hypertension   [place], Enobong, MD   5 months ago Essential hypertension   [place], Jarome Matin, RPH-CPP   5 months ago Essential hypertension   [place], [place], MD   7 months ago Acute right-sided low back pain with right-sided sciatica   [place] Mayers, Cari S, [place]   11 months ago Pure hypercholesterolemia   [facility] [place], Charlane Ferretti, MD

## 2021-10-30 ENCOUNTER — Other Ambulatory Visit: Payer: Self-pay | Admitting: Family Medicine

## 2021-10-30 DIAGNOSIS — G8929 Other chronic pain: Secondary | ICD-10-CM

## 2021-12-04 ENCOUNTER — Other Ambulatory Visit: Payer: Self-pay | Admitting: Family Medicine

## 2021-12-04 DIAGNOSIS — G8929 Other chronic pain: Secondary | ICD-10-CM

## 2021-12-04 NOTE — Telephone Encounter (Signed)
Medication Refill - Medication: gabapentin (NEURONTIN) 300 MG capsule  Has the patient contacted their pharmacy? Yes, No, more refills.    (Agent: If yes, when and what did the pharmacy advise?)  Preferred Pharmacy (with phone number or street name):  [facility], [place]  [place] [place] [address]  Phone: [phone] Fax: [phone]  Hours: Not open 24 hours   Has the patient been seen for an appointment in the last year OR does the patient have an upcoming appointment? Yes.    Agent: Please be advised that RX refills may take up to 3 business days. We ask that you follow-up with your pharmacy.

## 2021-12-04 NOTE — Telephone Encounter (Signed)
  Requested medication (s) are due for refill today: yes  Requested medication (s) are on the active medication list: yes  Last refill:  10/30/21 #60 with 0 RF  Future visit scheduled: no, was seen 08/12/21 and asked to return in 6 months, Sept.  Notes to clinic:  I see this was originally ordered by a sports MD so was not sure you were going to continue to write it. You have been writing for only 60 instead of 90 with instructions to take 3xd, not as needed. Please assess.     Requested Prescriptions  Pending Prescriptions Disp Refills   gabapentin (NEURONTIN) 300 MG capsule 60 capsule 0     Neurology: Anticonvulsants - gabapentin Passed - 12/04/2021 12:40 PM      Passed - Cr in normal range and within 360 days    Creatinine, Ser  Date Value Ref Range Status  08/12/2021 1.26 0.76 - 1.27 mg/dL Final         Passed - Completed PHQ-2 or PHQ-9 in the last 360 days      Passed - Valid encounter within last 12 months    Recent Outpatient Visits           3 months ago Essential hypertension   [place], Enobong, MD   6 months ago Essential hypertension   [place], RPH-CPP   6 months ago Essential hypertension   [place], [place], MD   8 months ago Acute right-sided low back pain with right-sided sciatica   [place] Mayers, [place], [place]   1 year ago Pure hypercholesterolemia   [facility] [place], Charlane Ferretti, MD

## 2021-12-05 MED ORDER — GABAPENTIN 300 MG PO CAPS: ORAL_CAPSULE | ORAL | 1 refills | Status: DC

## 2022-01-20 ENCOUNTER — Other Ambulatory Visit: Payer: Self-pay | Admitting: Family Medicine

## 2022-01-20 DIAGNOSIS — G8929 Other chronic pain: Secondary | ICD-10-CM

## 2022-01-20 NOTE — Telephone Encounter (Unsigned)
Copied from [place] [id number]. Topic: General - Other >> Jan 20, 2022 11:58 AM Everette C wrote: Reason for CRM: Medication Refill - Medication: gabapentin (NEURONTIN) 300 MG capsule [[id number]]   Has the patient contacted their pharmacy? Yes.  The patient was directed to contact their PCP  (Agent: If no, request that the patient contact the pharmacy for the refill. If patient does not wish to contact the pharmacy document the reason why and proceed with request.) (Agent: If yes, when and what did the pharmacy advise?)  Preferred Pharmacy (with phone number or street name): [facility], [address] [place] [address] Phone: [phone] Fax: [phone] Hours: Not open 24 hours   Has the patient been seen for an appointment in the last year OR does the patient have an upcoming appointment? Yes.    Agent: Please be advised that RX refills may take up to 3 business days. We ask that you follow-up with your pharmacy.

## 2022-01-21 NOTE — Telephone Encounter (Signed)
Requested medication (s) are due for refill today: Yes  Requested medication (s) are on the active medication list: Yes  Last refill:  12/05/21  Future visit scheduled: No  Notes to clinic:  Left message to call and make appointment.    Requested Prescriptions  Pending Prescriptions Disp Refills   gabapentin (NEURONTIN) 300 MG capsule 60 capsule 1    Sig: TAKE 1 CAPSULE(300 MG) BY MOUTH THREE TIMES DAILY     Neurology: Anticonvulsants - gabapentin Passed - 01/20/2022  3:30 PM      Passed - Cr in normal range and within 360 days    Creatinine, Ser  Date Value Ref Range Status  08/12/2021 1.26 0.76 - 1.27 mg/dL Final         Passed - Completed PHQ-2 or PHQ-9 in the last 360 days      Passed - Valid encounter within last 12 months    Recent Outpatient Visits           5 months ago Essential hypertension   [place], Enobong, MD   8 months ago Essential hypertension   [place], Jarome Matin, RPH-CPP   8 months ago Essential hypertension   [place], [place], MD   10 months ago Acute right-sided low back pain with right-sided sciatica   [place] Mayers, [place], [place]   1 year ago Pure hypercholesterolemia    [facility] [place], Charlane Ferretti, MD

## 2022-01-22 MED ORDER — GABAPENTIN 300 MG PO CAPS: ORAL_CAPSULE | ORAL | 1 refills | Status: DC

## 2022-01-26 ENCOUNTER — Other Ambulatory Visit: Payer: Self-pay | Admitting: Cardiology

## 2022-01-27 ENCOUNTER — Other Ambulatory Visit: Payer: Self-pay | Admitting: Cardiology

## 2022-03-07 IMAGING — CR DG CHEST 2V
2 series · 2 of 2 positions shown · non-contrast
Comparison: No prior.

CLINICAL DATA: Hypertension.

EXAM:
CHEST - 2 VIEW

[w chest pa]
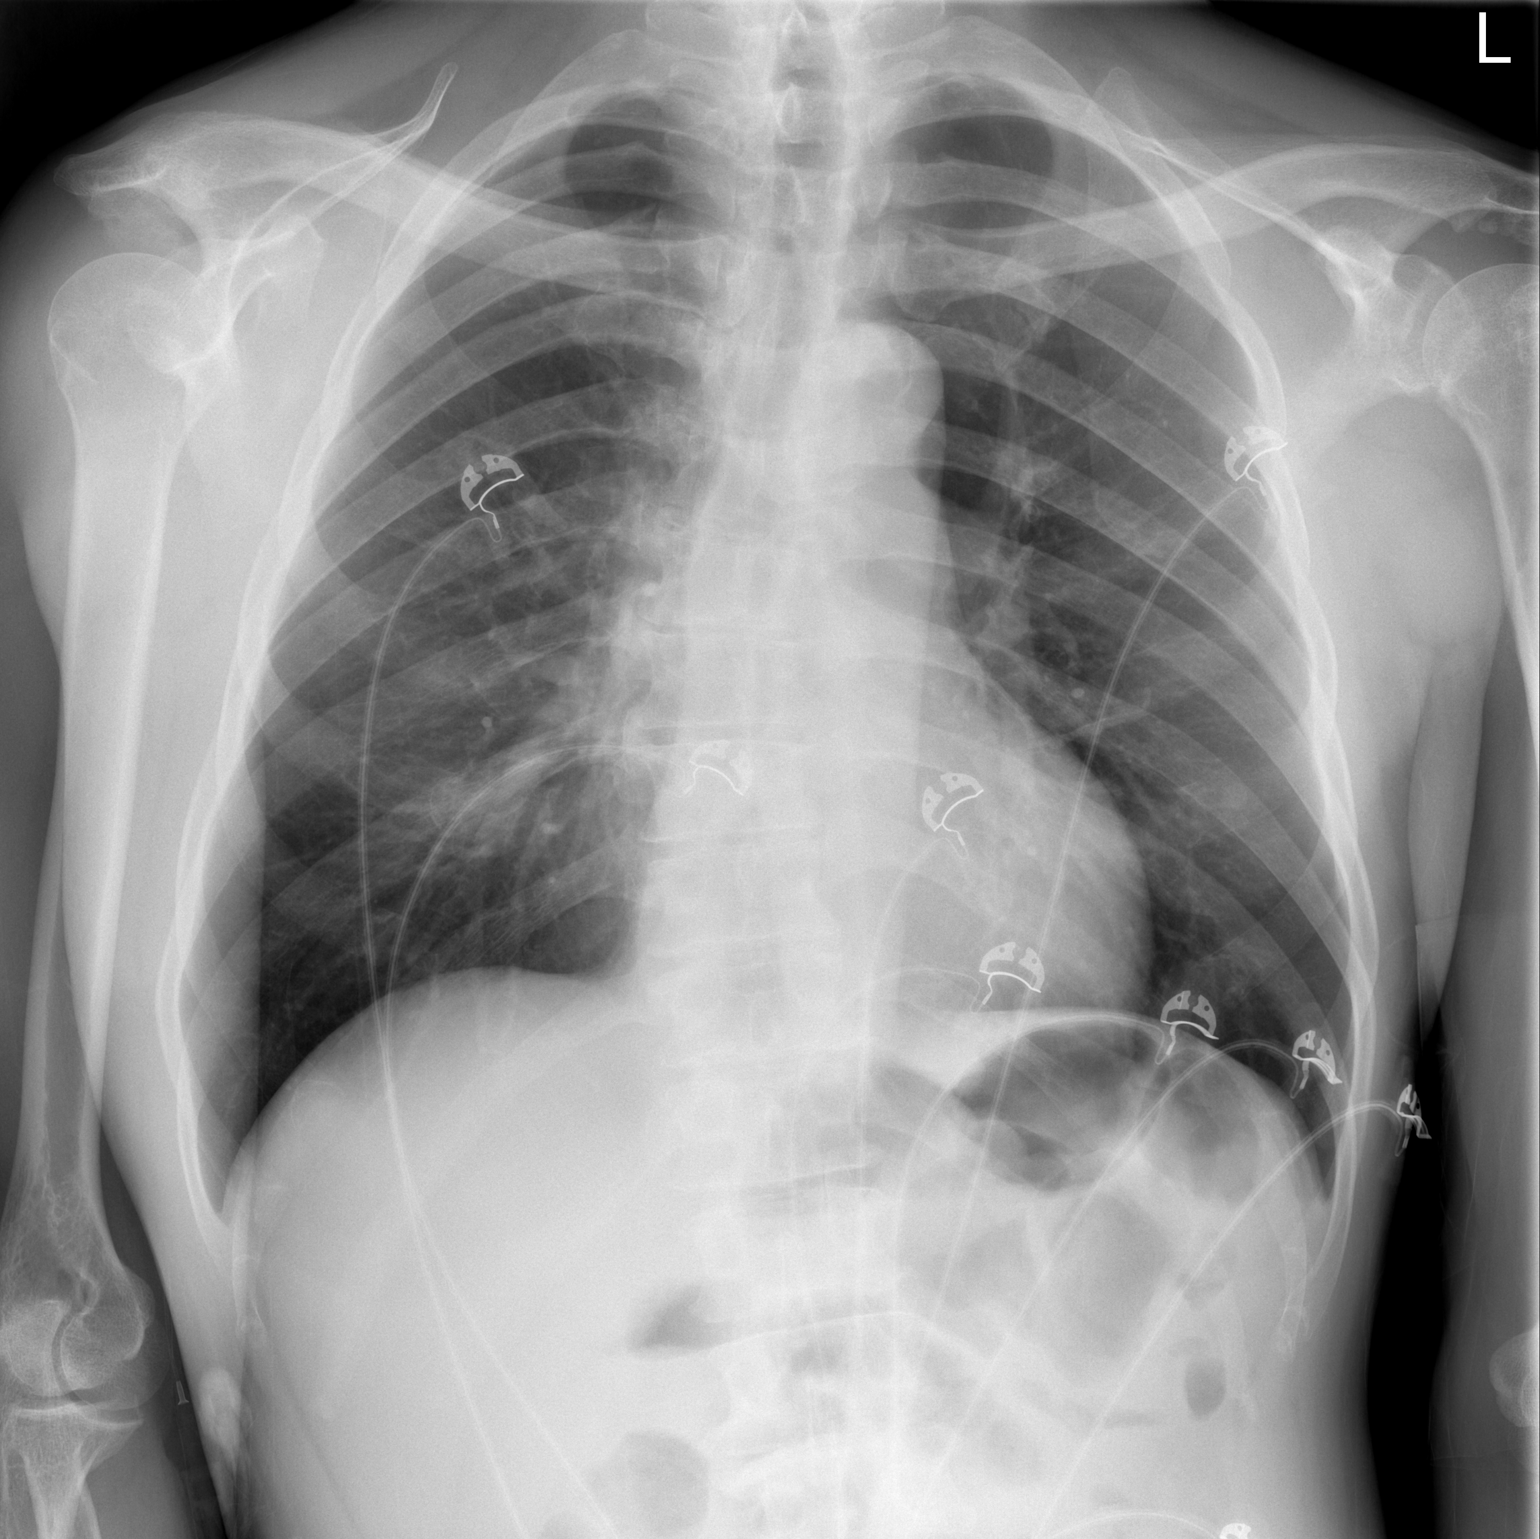

[w chest lat]
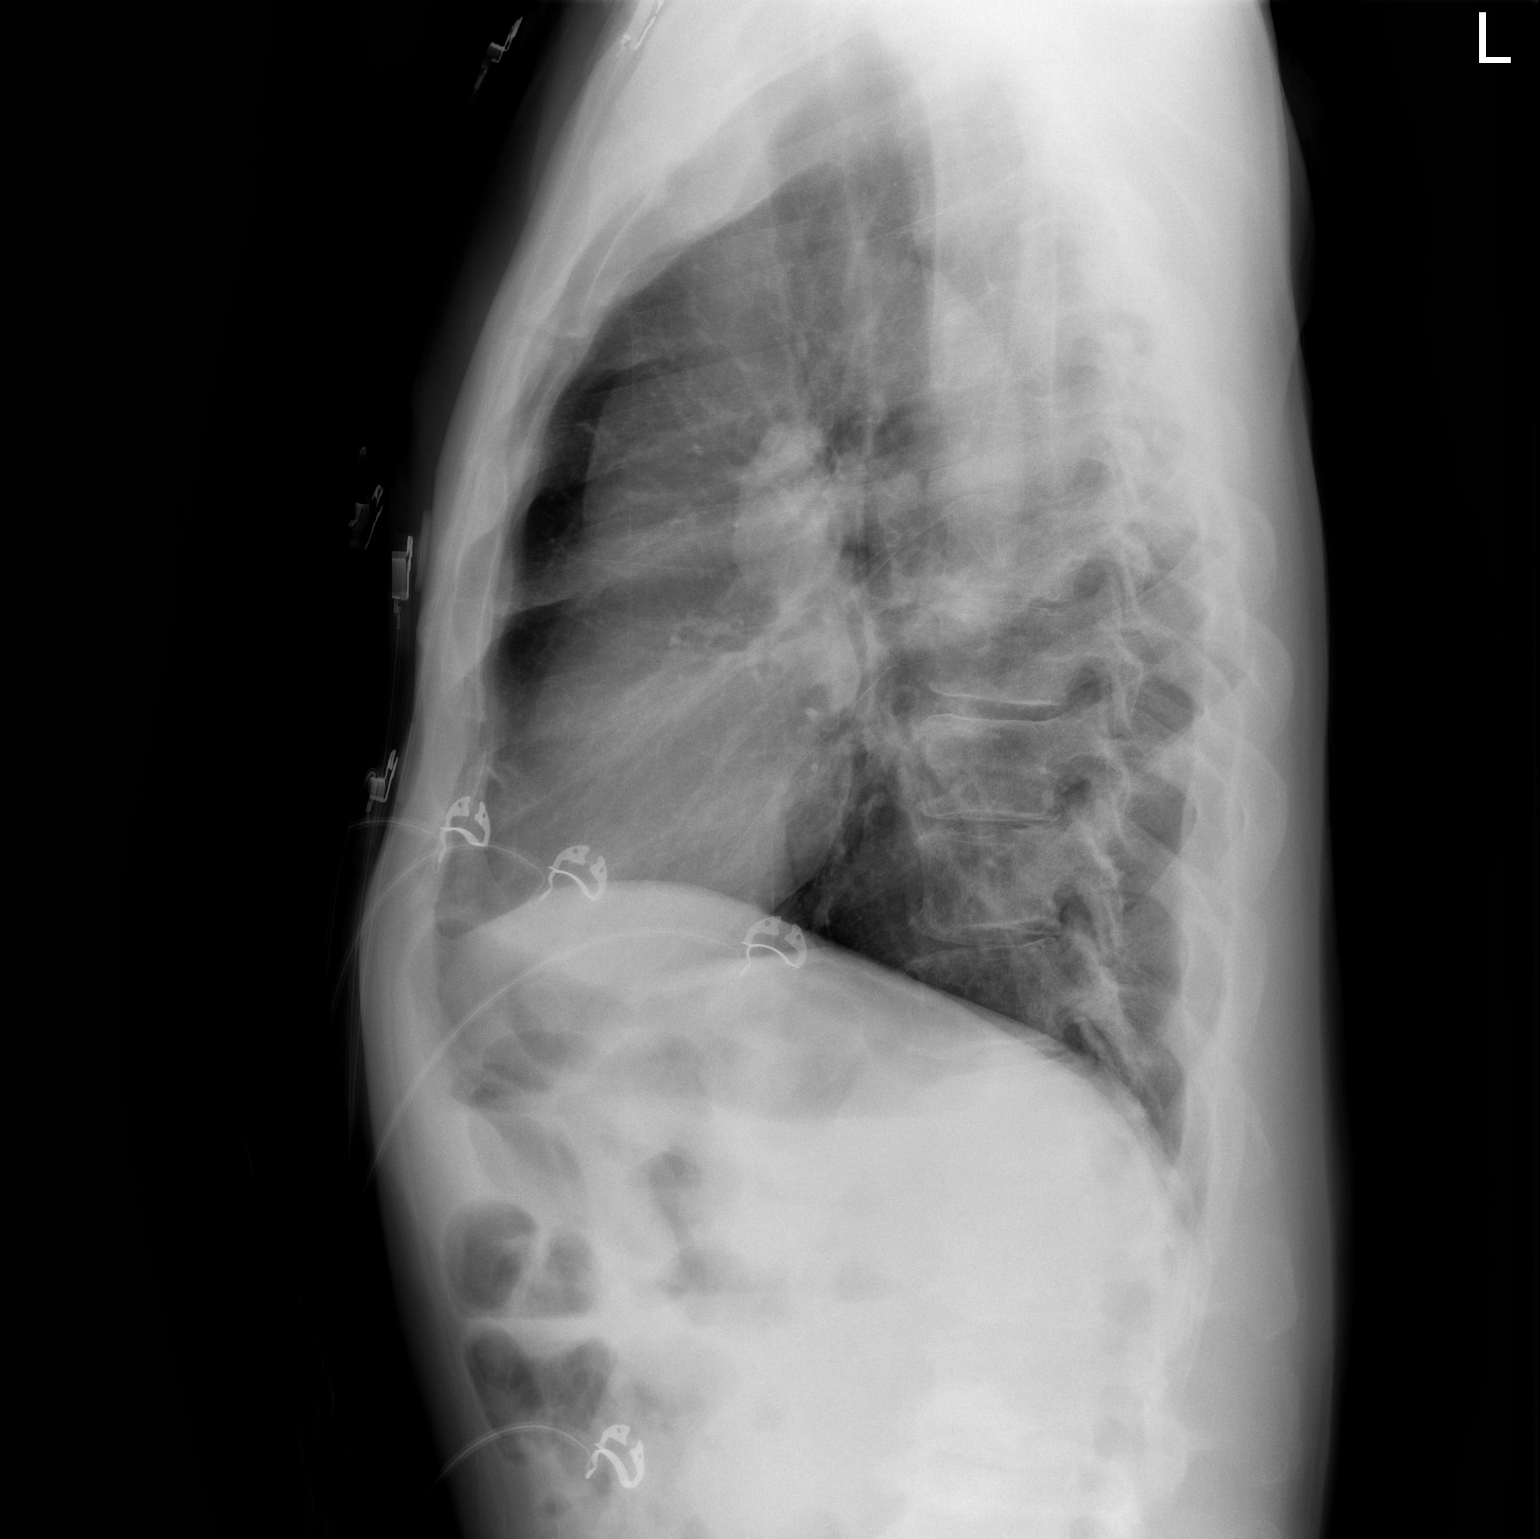

[2 of 2 positions shown; findings below may reference images not displayed]

FINDINGS: Mediastinum and hilar structures normal. Heart size normal. No
pulmonary venous congestion. No focal infiltrate. Nodular opacity
noted over the left lung base. This may represent a nipple shadow.
Repeat PA and lateral chest x-ray with nipple markers suggested. No
pleural effusion or pneumothorax. Slightly proud bowel with
air-fluid levels noted. Abdominal series should be considered for
further evaluation. Degenerative changes scoliosis thoracic spine.
Degenerative changes both shoulders.
IMPRESSION: 1. No acute cardiopulmonary disease identified.
2. Nodular opacity noted over the left lung base. This may represent
a nipple shadow. Repeat PA and lateral chest x-ray with nipple
markers suggested.
3. Slightly prominent bowel with air-fluid levels noted. Abdominal
series should be considered for further evaluation.

## 2022-03-13 ENCOUNTER — Other Ambulatory Visit: Payer: Self-pay | Admitting: Cardiology

## 2022-03-13 NOTE — Telephone Encounter (Signed)
Refill to pharmacy with message needs appointment for future refill / 1st attempt

## 2022-04-24 ENCOUNTER — Other Ambulatory Visit: Payer: Self-pay | Admitting: Family Medicine

## 2022-04-24 DIAGNOSIS — E7800 Pure hypercholesterolemia, unspecified: Secondary | ICD-10-CM

## 2022-04-24 DIAGNOSIS — I10 Essential (primary) hypertension: Secondary | ICD-10-CM

## 2022-04-24 NOTE — Telephone Encounter (Signed)
Requested Prescriptions  Pending Prescriptions Disp Refills   atorvastatin (LIPITOR) 40 MG tablet [Pharmacy Med Name: ATORVASTATIN 40MG TABLETS] 90 tablet 0    Sig: TAKE 1 TABLET(40 MG) BY MOUTH DAILY     Cardiovascular:  Antilipid - Statins Failed - 04/24/2022  2:11 PM      Failed - Lipid Panel in normal range within the last 12 months    Cholesterol, Total  Date Value Ref Range Status  10/16/2021 175 100 - 199 mg/dL Final   LDL Chol Calc (NIH)  Date Value Ref Range Status  10/16/2021 64 0 - 99 mg/dL Final   HDL  Date Value Ref Range Status  10/16/2021 99 >39 mg/dL Final   Triglycerides  Date Value Ref Range Status  10/16/2021 59 0 - 149 mg/dL Final         Passed - Patient is not pregnant      Passed - Valid encounter within last 12 months    Recent Outpatient Visits           8 months ago Essential hypertension   [place], Charlane Ferretti, MD   11 months ago Essential hypertension   [place], RPH-CPP   11 months ago Essential hypertension   [place], [place], MD   1 year ago Acute right-sided low back pain with right-sided sciatica   [place], Cari S, [place]   1 year ago Pure hypercholesterolemia   [place], [place], MD               amLODipine (NORVASC) 10 MG tablet [Pharmacy Med Name: AMLODIPINE BESYLATE 10MG TABLETS] 90 tablet 0    Sig: TAKE 1 TABLET(10 MG) BY MOUTH DAILY     Cardiovascular: Calcium Channel Blockers 2 Failed - 04/24/2022  2:11 PM      Failed - Last BP in normal range    BP Readings from Last 1 Encounters:  10/08/21 (!) 162/83         Failed - Valid encounter within last 6 months    Recent Outpatient Visits           8 months ago Essential hypertension   [place], Charlane Ferretti, MD   11 months  ago Essential hypertension   [place], Jarome Matin, RPH-CPP   11 months ago Essential hypertension   [place], [place], MD   1 year ago Acute right-sided low back pain with right-sided sciatica   [place], Cari S, [place]   1 year ago Pure hypercholesterolemia   [place], [place], MD              Passed - Last Heart Rate in normal range    Pulse Readings from Last 1 Encounters:  10/08/21 (!) 50          losartan-hydrochlorothiazide (HYZAAR) 100-25 MG tablet [Pharmacy Med Name: LOSARTAN/HCTZ 100/25MG TABLETS] 90 tablet 0    Sig: TAKE 1 TABLET BY MOUTH DAILY     Cardiovascular: ARB + Diuretic Combos Failed - 04/24/2022  2:11 PM      Failed - K in normal range and within 180 days    Potassium  Date Value Ref Range Status  08/12/2021 4.5 3.5 - 5.2  mmol/L Final         Failed - Na in normal range and within 180 days    Sodium  Date Value Ref Range Status  08/12/2021 139 134 - 144 mmol/L Final         Failed - Cr in normal range and within 180 days    Creatinine, Ser  Date Value Ref Range Status  08/12/2021 1.26 0.76 - 1.27 mg/dL Final         Failed - eGFR is 10 or above and within 180 days    GFR calc Af Amer  Date Value Ref Range Status  06/21/2020 103 >59 mL/min/1.73 Final    Comment:    **In accordance with recommendations from the NKF-ASN Task force,**   [facility] is in the process of updating its eGFR calculation to the   2021 CKD-EPI creatinine equation that estimates kidney function   without a race variable.    GFR, Estimated  Date Value Ref Range Status  05/17/2020 >60 >60 mL/min Final    Comment:    (NOTE) Calculated using the CKD-EPI Creatinine Equation (2021)    GFR calc non Af Amer  Date Value Ref Range Status  06/21/2020 89 >59 mL/min/1.73 Final   eGFR  Date Value Ref Range Status   08/12/2021 63 >59 mL/min/1.73 Final         Failed - Last BP in normal range    BP Readings from Last 1 Encounters:  10/08/21 (!) 162/83         Failed - Valid encounter within last 6 months    Recent Outpatient Visits           8 months ago Essential hypertension   [place], Enobong, MD   11 months ago Essential hypertension   [place], RPH-CPP   11 months ago Essential hypertension   [place], Charlane Ferretti, MD   1 year ago Acute right-sided low back pain with right-sided sciatica   [place] Mayers, [place], [place]   1 year ago Pure hypercholesterolemia   [place], MD              Passed - Patient is not pregnant

## 2022-06-10 ENCOUNTER — Other Ambulatory Visit: Payer: Self-pay | Admitting: Cardiology

## 2022-06-11 ENCOUNTER — Other Ambulatory Visit: Payer: Self-pay | Admitting: Cardiology

## 2022-06-11 NOTE — Telephone Encounter (Signed)
Rx refill sent to pharmacy. 

## 2022-07-09 ENCOUNTER — Other Ambulatory Visit: Payer: Self-pay | Admitting: Family Medicine

## 2022-07-09 ENCOUNTER — Other Ambulatory Visit: Payer: Self-pay | Admitting: Cardiology

## 2022-07-09 ENCOUNTER — Telehealth: Payer: Self-pay | Admitting: Cardiology

## 2022-07-09 DIAGNOSIS — G8929 Other chronic pain: Secondary | ICD-10-CM

## 2022-07-09 MED ORDER — SPIRONOLACTONE 25 MG PO TABS: 25.0000 mg | ORAL_TABLET | Freq: Every day | ORAL | 0 refills | Status: DC

## 2022-07-09 NOTE — Telephone Encounter (Signed)
*  STAT* If patient is at the pharmacy, call can be transferred to refill team.   1. Which medications need to be refilled? (please list name of each medication and dose if known) gabapentin (NEURONTIN) 300 MG capsule   2. Which pharmacy/location (including street and city if local pharmacy) is medication to be sent to? [id number] - [place], [address] - [address]   3. Do they need a 30 day or 90 day supply? 30   Patient has made appt with Dr. Agustin Cree 02/07

## 2022-07-09 NOTE — Telephone Encounter (Signed)
Requested medication (s) are due for refill today: yes  Requested medication (s) are on the active medication list: yes  Last refill:  01/22/22  Future visit scheduled:yes  Notes to clinic:  Unable to refill per protocol, courtesy refill already given, routing for provider approval.      Requested Prescriptions  Pending Prescriptions Disp Refills   gabapentin (NEURONTIN) 300 MG capsule [Pharmacy Med Name: GABAPENTIN '300MG'$  CAPSULES] 60 capsule 1    Sig: TAKE 1 CAPSULE(300 MG) BY MOUTH THREE TIMES DAILY     Neurology: Anticonvulsants - gabapentin Passed - 07/09/2022  4:01 PM      Passed - Cr in normal range and within 360 days    Creatinine, Ser  Date Value Ref Range Status  08/12/2021 1.26 0.76 - 1.27 mg/dL Final         Passed - Completed PHQ-2 or PHQ-9 in the last 360 days      Passed - Valid encounter within last 12 months    Recent Outpatient Visits           11 months ago Essential hypertension   [place], Charlane Ferretti, MD   1 year ago Essential hypertension   [place], Jarome Matin, RPH-CPP   1 year ago Essential hypertension   [place], Charlane Ferretti, MD   1 year ago Acute right-sided low back pain with right-sided sciatica   [place], [place]   1 year ago Pure hypercholesterolemia   [place], Enobong, MD       Future Appointments             In 1 week Agustin Cree, Marily Lente, MD [place] at [facility]

## 2022-07-09 NOTE — Telephone Encounter (Signed)
Pt called reporting that this medication needs a Prior Authorization

## 2022-07-09 NOTE — Telephone Encounter (Signed)
Rx refill sent to pharmacy.  Called pt to let him know we don't Rx Gabapentin and pt apologized. He meant to ask for refill of Spironolactone. Rx sent to pharmace

## 2022-07-09 NOTE — Telephone Encounter (Signed)
Pt called and stated he needs a refill for Gabapentin / not a PA but that the pharmacy advised him to get the refill authorized by his pcp/ please advise asap / pt is almost out of medication   [place], [place]

## 2022-07-16 ENCOUNTER — Ambulatory Visit: Payer: PPO | Attending: Cardiology | Admitting: Cardiology

## 2022-07-16 ENCOUNTER — Encounter: Payer: Self-pay | Admitting: Cardiology

## 2022-07-16 VITALS — BP 124/72 | HR 55 | Ht 69.0 in | Wt 143.0 lb

## 2022-07-16 DIAGNOSIS — I10 Essential (primary) hypertension: Secondary | ICD-10-CM | POA: Diagnosis not present

## 2022-07-16 DIAGNOSIS — E785 Hyperlipidemia, unspecified: Secondary | ICD-10-CM | POA: Insufficient documentation

## 2022-07-16 DIAGNOSIS — F17200 Nicotine dependence, unspecified, uncomplicated: Secondary | ICD-10-CM

## 2022-07-16 DIAGNOSIS — I714 Abdominal aortic aneurysm, without rupture, unspecified: Secondary | ICD-10-CM

## 2022-07-16 DIAGNOSIS — R001 Bradycardia, unspecified: Secondary | ICD-10-CM | POA: Diagnosis not present

## 2022-07-16 MED ORDER — AMLODIPINE BESYLATE 10 MG PO TABS: 10.0000 mg | ORAL_TABLET | Freq: Every day | ORAL | 2 refills | Status: DC

## 2022-07-16 MED ORDER — SPIRONOLACTONE 25 MG PO TABS: 25.0000 mg | ORAL_TABLET | Freq: Every day | ORAL | 2 refills | Status: DC

## 2022-07-16 NOTE — Patient Instructions (Signed)
Medication Instructions:  Your physician recommends that you continue on your current medications as directed. Please refer to the Current Medication list given to you today.  *If you need a refill on your cardiac medications before your next appointment, please call your pharmacy*   Lab Work: None Ordered If you have labs (blood work) drawn today and your tests are completely normal, you will receive your results only by: [place] (if you have MyChart) OR A paper copy in the mail If you have any lab test that is abnormal or we need to change your treatment, we will call you to review the results.   Testing/Procedures: Your physician has requested that you have an abdominal aorta duplex. During this test, an ultrasound is used to evaluate the aorta. Allow 30 minutes for this exam. Do not eat after midnight the day before and avoid carbonated beverages    Follow-Up: At [facility], you and your health needs are our priority.  As part of our continuing mission to provide you with exceptional heart care, we have created designated Provider Care Teams.  These Care Teams include your primary Cardiologist (physician) and Advanced Practice Providers (APPs -  Physician Assistants and Nurse Practitioners) who all work together to provide you with the care you need, when you need it.  We recommend signing up for the patient portal called "MyChart".  Sign up information is provided on this After Visit Summary.  MyChart is used to connect with patients for Virtual Visits (Telemedicine).  Patients are able to view lab/test results, encounter notes, upcoming appointments, etc.  Non-urgent messages can be sent to your provider as well.   To learn more about what you can do with MyChart, go to [email or web address].    Your next appointment:   12 month(s)  The format for your next appointment:   In Person  Provider:   Jenne Campus, MD    Other Instructions NA   This visit was  accompanied by Enrigue Catena.

## 2022-07-16 NOTE — Progress Notes (Signed)
Cardiology Office Note:    Date:  07/16/2022   ID:  Isaac Chan, DOB December 16, 1955, MRN [id number]  PCP:  Charlott Rakes, MD  Cardiologist:  Jenne Campus, MD    Referring MD: Charlott Rakes, MD   Chief Complaint  Patient presents with   Medication Refill  Doing well  History of Present Illness:    Isaac Chan is a 67 y.o. male with past medical history significant for essential hypertension which seems to be somewhat difficult to control but now under control, bradycardia asymptomatic, dyspnea on exertion, smoking but only 5 cigarettes a day. He is in my office today for follow-up.  Overall doing very well.  Still works have no difficulty doing it.  Denies have any chest pain tightness squeezing pressure burning chest no palpitations dizziness swelling of lower extremities.  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   Dizziness 05/23/2020   Dyspnea on exertion 05/23/2020   Essential hypertension 05/23/2020   Hyperlipidemia    Shoulder injury, left, initial encounter 07/02/2017   Smoking 05/23/2020    Past Surgical History:  Procedure Laterality Date   COLONOSCOPY     R wrist fx repaired     SHOULDER ARTHROSCOPY Left     Current Medications: Current Meds  Medication Sig   amLODipine (NORVASC) 10 MG tablet TAKE 1 TABLET(10 MG) BY MOUTH DAILY (Patient taking differently: Take 10 mg by mouth daily. TAKE 1 TABLET(10 MG) BY MOUTH DAILY)   atorvastatin (LIPITOR) 40 MG tablet TAKE 1 TABLET(40 MG) BY MOUTH DAILY (Patient taking differently: Take 40 mg by mouth daily.)   gabapentin (NEURONTIN) 300 MG capsule TAKE 1 CAPSULE(300 MG) BY MOUTH THREE TIMES DAILY (Patient taking differently: Take 300 mg by mouth 3 (three) times daily. TAKE 1 CAPSULE(300 MG) BY MOUTH THREE TIMES DAILY)   losartan-hydrochlorothiazide (HYZAAR) 100-25 MG tablet TAKE 1 TABLET BY MOUTH DAILY   oxyCODONE-acetaminophen (PERCOCET/ROXICET) 5-325 MG tablet Take 1 tablet by mouth every 6  (six) hours as needed for severe pain.   spironolactone (ALDACTONE) 25 MG tablet Take 1 tablet (25 mg total) by mouth daily.   tiZANidine (ZANAFLEX) 4 MG tablet Take 1 tablet (4 mg total) by mouth every 6 (six) hours as needed for up to 15 doses for muscle spasms.     Allergies:   Patient has no known allergies.   Social History   Socioeconomic History   Marital status: Married    Spouse name: Not on file   Number of children: Not on file   Years of education: Not on file   Highest education level: Not on file  Occupational History   Not on file  Tobacco Use   Smoking status: Former    Packs/day: 0.25    Years: 46.00    Total pack years: 11.50    Types: Cigarettes    Quit date: 07/09/2021    Years since quitting: 1.0   Smokeless tobacco: Never  Vaping Use   Vaping Use: Never used  Substance and Sexual Activity   Alcohol use: Yes    Comment: once day-beer   Drug use: Yes    Types: Marijuana    Comment: daily, last was 10/02/21 this week   Sexual activity: Yes  Other Topics Concern   Not on file  Social History Narrative   Not on file   Social Determinants of Health   Financial Resource Strain: Low Risk  (12/07/2020)   Overall Financial Resource Strain (CARDIA)    Difficulty of Paying  Living Expenses: Not hard at all  Food Insecurity: No Food Insecurity (12/07/2020)   Hunger Vital Sign    Worried About Running Out of Food in the Last Year: Never true    Ran Out of Food in the Last Year: Never true  Transportation Needs: No Transportation Needs (12/07/2020)   PRAPARE - Hydrologist (Medical): No    Lack of Transportation (Non-Medical): No  Physical Activity: Inactive (12/07/2020)   Exercise Vital Sign    Days of Exercise per Week: 0 days    Minutes of Exercise per Session: 0 min  Stress: No Stress Concern Present (12/07/2020)   [name]    Feeling of Stress : Not at all  Social  Connections: Magazine (12/07/2020)   Social Connection and Isolation Panel [NHANES]    Frequency of Communication with Friends and Family: More than three times a week    Frequency of Social Gatherings with Friends and Family: More than three times a week    Attends Religious Services: More than 4 times per year    Active Member of [employer] or Organizations: Yes    Attends Music therapist: More than 4 times per year    Marital Status: Married     Family History: The patient's family history includes Prostate cancer in his father. There is no history of Colon cancer, Colon polyps, Esophageal cancer, Stomach cancer, or Rectal cancer. ROS:   Please see the history of present illness.    All 14 point review of systems negative except as described per history of present illness  EKGs/Labs/Other Studies Reviewed:      Recent Labs: 08/12/2021: ALT 26; BUN 22; Creatinine, Ser 1.26; Potassium 4.5; Sodium 139  Recent Lipid Panel    Component Value Date/Time   CHOL 175 10/16/2021 1356   TRIG 59 10/16/2021 1356   HDL 99 10/16/2021 1356   CHOLHDL 2.8 06/21/2020 0850   LDLCALC 64 10/16/2021 1356    Physical Exam:    VS:  BP 124/72 (BP Location: Left Arm, Patient Position: Sitting)   Pulse (!) 55   Ht 5' 9"$  (1.753 m)   Wt 143 lb (64.9 kg)   SpO2 94%   BMI 21.12 kg/m     Wt Readings from Last 3 Encounters:  07/16/22 143 lb (64.9 kg)  10/04/21 146 lb (66.2 kg)  09/26/21 146 lb (66.2 kg)     GEN:  Well nourished, well developed in no acute distress HEENT: Normal NECK: No JVD; No carotid bruits LYMPHATICS: No lymphadenopathy CARDIAC: RRR, no murmurs, no rubs, no gallops RESPIRATORY:  Clear to auscultation without rales, wheezing or rhonchi  ABDOMEN: Soft, non-tender, non-distended MUSCULOSKELETAL:  No edema; No deformity  SKIN: Warm and dry LOWER EXTREMITIES: no swelling NEUROLOGIC:  Alert and oriented x 3 PSYCHIATRIC:  Normal affect   ASSESSMENT:    1.  Smoking   2. Essential hypertension   3. Bradycardia   4. Dyslipidemia    PLAN:    In order of problems listed above:  Smoking still ongoing but he said he smokes about 5 cigarettes a week.  Ask him simply to stop it. Essential hypertension blood pressure well-controlled we will give him a refill on Aldactone/spironolactone. Bradycardia noted on the EKG with heart rate 55 with some PVCs, but he is completely asymptomatic.  He can work hard physically and have no difficulty doing it. Dyslipidemia I did review his K PN which  show me his LDL of 64 HDL 99.  Will continue his Lipitor 40 mg daily which is high intense statin. Overall he is doing well I will see him back in 1 year since he is a smoker more than 67 years old I will schedule him to have abdominal aortic ultrasounds to rule rule out abdominal aortic aneurysm   Medication Adjustments/Labs and Tests Ordered: Current medicines are reviewed at length with the patient today.  Concerns regarding medicines are outlined above.  No orders of the defined types were placed in this encounter.  Medication changes: No orders of the defined types were placed in this encounter.   Signed, Park Liter, MD, [facility] 07/16/2022 1:26 PM    [place]

## 2022-07-25 ENCOUNTER — Other Ambulatory Visit: Payer: Self-pay | Admitting: Family Medicine

## 2022-07-25 DIAGNOSIS — I10 Essential (primary) hypertension: Secondary | ICD-10-CM

## 2022-07-25 DIAGNOSIS — E7800 Pure hypercholesterolemia, unspecified: Secondary | ICD-10-CM

## 2022-07-28 ENCOUNTER — Ambulatory Visit (HOSPITAL_COMMUNITY)
Admission: RE | Admit: 2022-07-28 | Discharge: 2022-07-28 | Disposition: A | Discharge status: Home / Self Care | Payer: PPO | Source: Ambulatory Visit | Attending: Cardiovascular Disease | Admitting: Cardiovascular Disease

## 2022-07-28 DIAGNOSIS — I714 Abdominal aortic aneurysm, without rupture, unspecified: Secondary | ICD-10-CM

## 2022-07-28 DIAGNOSIS — F17210 Nicotine dependence, cigarettes, uncomplicated: Secondary | ICD-10-CM | POA: Insufficient documentation

## 2022-07-28 DIAGNOSIS — Z136 Encounter for screening for cardiovascular disorders: Secondary | ICD-10-CM | POA: Diagnosis not present

## 2022-08-08 ENCOUNTER — Telehealth: Payer: Self-pay | Admitting: Family Medicine

## 2022-08-08 NOTE — Telephone Encounter (Signed)
Called patient to schedule Medicare Annual Wellness Visit (AWV). Left message for patient to call back and schedule Medicare Annual Wellness Visit (AWV).  Last date of AWV: 12/07/20   If any questions, please contact me at [phone].  Thank you ,  Barkley Boards AWV direct phone # [phone]

## 2022-08-08 NOTE — Telephone Encounter (Signed)
Contacted Isaac Chan to schedule their annual wellness visit. Appointment made for 08/18/22.  Isaac Chan AWV direct phone # [phone]

## 2022-08-12 ENCOUNTER — Other Ambulatory Visit: Payer: Self-pay | Admitting: Family Medicine

## 2022-08-12 DIAGNOSIS — G8929 Other chronic pain: Secondary | ICD-10-CM

## 2022-08-12 NOTE — Telephone Encounter (Signed)
Copied from [place] [id number]. Topic: General - Other >> Aug 12, 2022 12:15 PM Everette C wrote: Reason for CRM: Medication Refill - Medication: spironolactone (ALDACTONE) 25 MG tablet [id number]  gabapentin (NEURONTIN) 300 MG capsule [id number]   Has the patient contacted their pharmacy? Yes.   (Agent: If no, request that the patient contact the pharmacy for the refill. If patient does not wish to contact the pharmacy document the reason why and proceed with request.) (Agent: If yes, when and what did the pharmacy advise?)  Preferred Pharmacy (with phone number or street name): [facility], [address] [place] [address] Phone: [phone] Fax: [phone] Hours: Not open 24 hours   Has the patient been seen for an appointment in the last year OR does the patient have an upcoming appointment? Yes.    Agent: Please be advised that RX refills may take up to 3 business days. We ask that you follow-up with your pharmacy.

## 2022-08-12 NOTE — Telephone Encounter (Signed)
Requested Prescriptions  Refused Prescriptions Disp Refills   gabapentin (NEURONTIN) 300 MG capsule 60 capsule 1     Neurology: Anticonvulsants - gabapentin Failed - 08/12/2022  1:34 PM      Failed - Cr in normal range and within 360 days    Creatinine, Ser  Date Value Ref Range Status  08/12/2021 1.26 0.76 - 1.27 mg/dL Final         Failed - Completed PHQ-2 or PHQ-9 in the last 360 days      Failed - Valid encounter within last 12 months    Recent Outpatient Visits           1 year ago Essential hypertension   [place], [place], MD   1 year ago Essential hypertension   [place], Jarome Matin, RPH-CPP   1 year ago Essential hypertension   [place], Charlane Ferretti, MD   1 year ago Acute right-sided low back pain with right-sided sciatica   [place], [place], [place]   1 year ago Pure hypercholesterolemia   [place], Charlane Ferretti, MD       Future Appointments             In 2 months Charlott Rakes, MD [place]             spironolactone (ALDACTONE) 25 MG tablet 90 tablet 2    Sig: Take 1 tablet (25 mg total) by mouth daily.     Cardiovascular: Diuretics - Aldosterone Antagonist Failed - 08/12/2022  1:34 PM      Failed - Cr in normal range and within 180 days    Creatinine, Ser  Date Value Ref Range Status  08/12/2021 1.26 0.76 - 1.27 mg/dL Final         Failed - K in normal range and within 180 days    Potassium  Date Value Ref Range Status  08/12/2021 4.5 3.5 - 5.2 mmol/L Final         Failed - Na in normal range and within 180 days    Sodium  Date Value Ref Range Status  08/12/2021 139 134 - 144 mmol/L Final         Failed - eGFR is 30 or above and within 180 days    GFR calc Af Amer  Date Value Ref Range  Status  06/21/2020 103 >59 mL/min/1.73 Final    Comment:    **In accordance with recommendations from the NKF-ASN Task force,**   [facility] is in the process of updating its eGFR calculation to the   2021 CKD-EPI creatinine equation that estimates kidney function   without a race variable.    GFR, Estimated  Date Value Ref Range Status  05/17/2020 >60 >60 mL/min Final    Comment:    (NOTE) Calculated using the CKD-EPI Creatinine Equation (2021)    GFR calc non Af Amer  Date Value Ref Range Status  06/21/2020 89 >59 mL/min/1.73 Final   eGFR  Date Value Ref Range Status  08/12/2021 63 >59 mL/min/1.73 Final         Failed - Valid encounter within last 6 months    Recent Outpatient Visits           1 year ago Essential hypertension   [place]  Charlott Rakes, MD   1 year ago Essential hypertension   [place], Jarome Matin, RPH-CPP   1 year ago Essential hypertension   [place], Charlane Ferretti, MD   1 year ago Acute right-sided low back pain with right-sided sciatica   [place], Cari S, [place]   1 year ago Pure hypercholesterolemia   [place], MD       Future Appointments             In 2 months Charlott Rakes, MD [place] BP in normal range    BP Readings from Last 1 Encounters:  07/16/22 124/72

## 2022-08-18 ENCOUNTER — Ambulatory Visit: Payer: PPO | Attending: Family Medicine

## 2022-08-18 VITALS — Ht 69.0 in | Wt 147.0 lb

## 2022-08-18 DIAGNOSIS — Z0000 Encounter for general adult medical examination without abnormal findings: Secondary | ICD-10-CM | POA: Diagnosis not present

## 2022-08-18 NOTE — Progress Notes (Signed)
I connected with  Isaac Chan on 08/18/22 by a audio enabled telemedicine application and verified that I am speaking with the correct person using two identifiers.  Patient Location: Home  Provider Location: Office/Clinic  I discussed the limitations of evaluation and management by telemedicine. The patient expressed understanding and agreed to proceed.  Subjective:   Isaac Chan is a 67 y.o. male who presents for Medicare Annual/Subsequent preventive examination.  Review of Systems     Cardiac Risk Factors include: advanced age (>7mn, >>73women);hypertension;male gender     Objective:    Today's Vitals   08/18/22 1436  Weight: 147 lb (66.7 kg)  Height: '5\' 9"'$  (1.753 m)   Body mass index is 21.71 kg/m.     08/18/2022    2:42 PM 09/09/2021   12:09 PM 12/07/2020   10:11 AM 05/17/2020    7:58 AM 12/17/2017    1:20 PM 06/29/2017   10:51 AM  Advanced Directives  Does Patient Have a Medical Advance Directive? No No No No No No  Would patient like information on creating a medical advance directive?  No - Patient declined No - Patient declined  No - Patient declined No - Patient declined    Current Medications (verified) Outpatient Encounter Medications as of 08/18/2022  Medication Sig   amLODipine (NORVASC) 10 MG tablet Take 1 tablet (10 mg total) by mouth daily. TAKE 1 TABLET(10 MG) BY MOUTH DAILY   atorvastatin (LIPITOR) 40 MG tablet Take 1 tablet (40 mg total) by mouth daily.   gabapentin (NEURONTIN) 300 MG capsule TAKE 1 CAPSULE(300 MG) BY MOUTH THREE TIMES DAILY (Patient taking differently: Take 300 mg by mouth 3 (three) times daily. TAKE 1 CAPSULE(300 MG) BY MOUTH THREE TIMES DAILY)   losartan-hydrochlorothiazide (HYZAAR) 100-25 MG tablet TAKE 1 TABLET BY MOUTH DAILY   spironolactone (ALDACTONE) 25 MG tablet Take 1 tablet (25 mg total) by mouth daily.   oxyCODONE-acetaminophen (PERCOCET/ROXICET) 5-325 MG tablet Take 1 tablet by mouth every 6 (six) hours as needed for  severe pain. (Patient not taking: Reported on 08/18/2022)   tiZANidine (ZANAFLEX) 4 MG tablet Take 1 tablet (4 mg total) by mouth every 6 (six) hours as needed for up to 15 doses for muscle spasms. (Patient not taking: Reported on 08/18/2022)   No facility-administered encounter medications on file as of 08/18/2022.    Allergies (verified) Patient has no known allergies.   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   Dizziness 05/23/2020   Dyspnea on exertion 05/23/2020   Essential hypertension 05/23/2020   Hyperlipidemia    Shoulder injury, left, initial encounter 07/02/2017   Smoking 05/23/2020   Past Surgical History:  Procedure Laterality Date   COLONOSCOPY     R wrist fx repaired     SHOULDER ARTHROSCOPY Left    Family History  Problem Relation Age of Onset   Prostate cancer Father    Colon cancer Neg Hx    Colon polyps Neg Hx    Esophageal cancer Neg Hx    Stomach cancer Neg Hx    Rectal cancer Neg Hx    Social History   Socioeconomic History   Marital status: Married    Spouse name: Not on file   Number of children: Not on file   Years of education: Not on file   Highest education level: Not on file  Occupational History   Not on file  Tobacco Use   Smoking status: Former    Packs/day: 0.25  Years: 46.00    Total pack years: 11.50    Types: Cigarettes    Quit date: 07/09/2021    Years since quitting: 1.1   Smokeless tobacco: Never  Vaping Use   Vaping Use: Never used  Substance and Sexual Activity   Alcohol use: Yes    Comment: once day-beer   Drug use: Yes    Types: Marijuana    Comment: daily, last was 10/02/21 this week   Sexual activity: Yes  Other Topics Concern   Not on file  Social History Narrative   Not on file   Social Determinants of Health   Financial Resource Strain: Low Risk  (08/18/2022)   Overall Financial Resource Strain (CARDIA)    Difficulty of Paying Living Expenses: Not hard at all  Food  Insecurity: No Food Insecurity (08/18/2022)   Hunger Vital Sign    Worried About Running Out of Food in the Last Year: Never true    Ran Out of Food in the Last Year: Never true  Transportation Needs: No Transportation Needs (08/18/2022)   PRAPARE - Hydrologist (Medical): No    Lack of Transportation (Non-Medical): No  Physical Activity: Inactive (08/18/2022)   Exercise Vital Sign    Days of Exercise per Week: 0 days    Minutes of Exercise per Session: 0 min  Stress: No Stress Concern Present (08/18/2022)   [place]    Feeling of Stress : Not at all  Social Connections: [place] (12/07/2020)   Social Connection and Isolation Panel [NHANES]    Frequency of Communication with Friends and Family: More than three times a week    Frequency of Social Gatherings with Friends and Family: More than three times a week    Attends Religious Services: More than 4 times per year    Active Member of [employer] or Organizations: Yes    Attends Music therapist: More than 4 times per year    Marital Status: Married    Tobacco Counseling Counseling given: Not Answered   Clinical Intake:  Pre-visit preparation completed: Yes  Pain : No/denies pain Pain Type: Chronic pain Pain Location: Leg Pain Orientation: Right, Left Pain Descriptors / Indicators: Numbness Pain Onset: More than a month ago Pain Frequency: Intermittent     Nutritional Status: BMI of 19-24  Normal Nutritional Risks: None Diabetes: No  How often do you need to have someone help you when you read instructions, pamphlets, or other written materials from your doctor or pharmacy?: 1 - Never  Diabetic? no  Interpreter Needed?: No  Information entered by :: NAllen LPN   Activities of Daily Living    08/18/2022    2:42 PM  In your present state of health, do you have any difficulty performing the following  activities:  Hearing? 0  Vision? 0  Difficulty concentrating or making decisions? 0  Walking or climbing stairs? 0  Dressing or bathing? 0  Doing errands, shopping? 0  Preparing Food and eating ? N  Using the Toilet? N  In the past six months, have you accidently leaked urine? N  Do you have problems with loss of bowel control? N  Managing your Medications? N  Managing your Finances? N  Housekeeping or managing your Housekeeping? N    Patient Care Team: Charlott Rakes, MD as PCP - General (Family Medicine)  Indicate any recent Medical Services you may have received from other than [facility] providers  in the past year (date may be approximate).     Assessment:   This is a routine wellness examination for [place].  Hearing/Vision screen Vision Screening - Comments:: No regular eye exams  Dietary issues and exercise activities discussed: Current Exercise Habits: The patient does not participate in regular exercise at present   Goals Addressed             This Visit's Progress    Patient Stated       08/18/2022, no goals       Depression Screen    08/18/2022    2:42 PM 08/12/2021    1:43 PM 05/13/2021    8:40 AM 12/07/2020   10:06 AM 11/12/2020    8:45 AM 08/08/2020    9:00 AM  PHQ 2/9 Scores  PHQ - 2 Score 0 0 0 0 0 0  PHQ- 9 Score  0 0  0 0    Fall Risk    08/18/2022    2:42 PM 08/12/2021    1:40 PM 05/13/2021    8:36 AM 12/07/2020   10:12 AM 08/08/2020    8:57 AM  Fall Risk   Falls in the past year? 0 0 0 0 0  Number falls in past yr: 0 0  0 0  Injury with Fall? 0 0  0 0  Risk for fall due to : Medication side effect   No Fall Risks   Follow up Falls prevention discussed;Education provided;Falls evaluation completed   Education provided     FALL RISK PREVENTION PERTAINING TO THE HOME:  Any stairs in or around the home? Yes  If so, are there any without handrails? No  Home free of loose throw rugs in walkways, pet beds, electrical cords, etc? Yes  Adequate lighting  in your home to reduce risk of falls? Yes   ASSISTIVE DEVICES UTILIZED TO PREVENT FALLS:  Life alert? No  Use of a cane, walker or w/c? No  Grab bars in the bathroom? No  Shower chair or bench in shower? No  Elevated toilet seat or a handicapped toilet? Yes   TIMED UP AND GO:  Was the test performed? No .      Cognitive Function:    12/07/2020   10:13 AM  MMSE - Mini Mental State Exam  Orientation to time 5  Orientation to Place 5  Registration 3  Attention/ Calculation 5  Recall 3  Language- name 2 objects 2  Language- repeat 1  Language- follow 3 step command 3  Language- read & follow direction 1  Write a sentence 1  Copy design 1  Total score 30        08/18/2022    2:43 PM  6CIT Screen  What Year? 0 points  What month? 0 points  What time? 0 points  Count back from 20 0 points  Months in reverse 0 points  Repeat phrase 0 points  Total Score 0 points    Immunizations Immunization History  Administered Date(s) Administered   PNEUMOCOCCAL CONJUGATE-20 05/13/2021    TDAP status: Due, Education has been provided regarding the importance of this vaccine. Advised may receive this vaccine at local pharmacy or Health Dept. Aware to provide a copy of the vaccination record if obtained from local pharmacy or Health Dept. Verbalized acceptance and understanding.  Flu Vaccine status: Declined, Education has been provided regarding the importance of this vaccine but patient still declined. Advised may receive this vaccine at local pharmacy or Health Dept.  Aware to provide a copy of the vaccination record if obtained from local pharmacy or Health Dept. Verbalized acceptance and understanding.  Pneumococcal vaccine status: Declined,  Education has been provided regarding the importance of this vaccine but patient still declined. Advised may receive this vaccine at local pharmacy or Health Dept. Aware to provide a copy of the vaccination record if obtained from local  pharmacy or Health Dept. Verbalized acceptance and understanding.   Covid-19 vaccine status: Declined, Education has been provided regarding the importance of this vaccine but patient still declined. Advised may receive this vaccine at local pharmacy or Health Dept.or vaccine clinic. Aware to provide a copy of the vaccination record if obtained from local pharmacy or Health Dept. Verbalized acceptance and understanding.  Qualifies for Shingles Vaccine? Yes   Zostavax completed No   Shingrix Completed?: No.    Education has been provided regarding the importance of this vaccine. Patient has been advised to call insurance company to determine out of pocket expense if they have not yet received this vaccine. Advised may also receive vaccine at local pharmacy or Health Dept. Verbalized acceptance and understanding.  Screening Tests Health Maintenance  Topic Date Due   COVID-19 Vaccine (1) Never done   DTaP/Tdap/Td (1 - Tdap) Never done   Zoster Vaccines- Shingrix (1 of 2) Never done   Medicare Annual Wellness (AWV)  12/07/2021   INFLUENZA VACCINE  Never done   COLONOSCOPY (Pts 45-37yr Insurance coverage will need to be confirmed)  10/04/2024   Pneumonia Vaccine 67 Years old  Completed   Hepatitis C Screening  Completed   HPV VACCINES  Aged Out    Health Maintenance  Health Maintenance Due  Topic Date Due   COVID-19 Vaccine (1) Never done   DTaP/Tdap/Td (1 - Tdap) Never done   Zoster Vaccines- Shingrix (1 of 2) Never done   Medicare Annual Wellness (AWV)  12/07/2021   INFLUENZA VACCINE  Never done    Colorectal cancer screening: Type of screening: Colonoscopy. Completed 10/04/2021. Repeat every 3 years  Lung Cancer Screening: (Low Dose CT Chest recommended if Age 67-80years, 30 pack-year currently smoking OR have quit w/in 15years.) does not qualify.   Lung Cancer Screening Referral: no  Additional Screening:  Hepatitis C Screening: does qualify; Completed 05/13/2021  Vision  Screening: Recommended annual ophthalmology exams for early detection of glaucoma and other disorders of the eye. Is the patient up to date with their annual eye exam?  No  Who is the provider or what is the name of the office in which the patient attends annual eye exams? [facility] If pt is not established with a provider, would they like to be referred to a provider to establish care? No .   Dental Screening: Recommended annual dental exams for proper oral hygiene  Community Resource Referral / Chronic Care Management: CRR required this visit?  No   CCM required this visit?  No      Plan:     I have personally reviewed and noted the following in the patient's chart:   Medical and social history Use of alcohol, tobacco or illicit drugs  Current medications and supplements including opioid prescriptions. Patient is not currently taking opioid prescriptions. Functional ability and status Nutritional status Physical activity Advanced directives List of other physicians Hospitalizations, surgeries, and ER visits in previous 12 months Vitals Screenings to include cognitive, depression, and falls Referrals and appointments  In addition, I have reviewed and discussed with patient certain preventive  protocols, quality metrics, and best practice recommendations. A written personalized care plan for preventive services as well as general preventive health recommendations were provided to patient.     Kellie Simmering, LPN   [id number]   Nurse Notes: none  Due to this being a virtual visit, the after visit summary with patients personalized plan was offered to patient via mail or my-chart.  Patient would like to access on my-chart

## 2022-08-18 NOTE — Patient Instructions (Signed)
Isaac Chan , Thank you for taking time to come for your Medicare Wellness Visit. I appreciate your ongoing commitment to your health goals. Please review the following plan we discussed and let me know if I can assist you in the future.   These are the goals we discussed:  Goals      Patient Stated     08/18/2022, no goals     Quit Smoking        This is a list of the screening recommended for you and due dates:  Health Maintenance  Topic Date Due   COVID-19 Vaccine (1) Never done   DTaP/Tdap/Td vaccine (1 - Tdap) Never done   Zoster (Shingles) Vaccine (1 of 2) Never done   Flu Shot  Never done   Medicare Annual Wellness Visit  08/18/2023   Colon Cancer Screening  10/04/2024   Pneumonia Vaccine  Completed   Hepatitis C Screening: USPSTF Recommendation to screen - Ages 18-79 yo.  Completed   HPV Vaccine  Aged Out    Advanced directives: Advance directive discussed with you today. .  Conditions/risks identified: none  Next appointment: Follow up in one year for your annual wellness visit.   Preventive Care 12 Years and Older, Male  Preventive care refers to lifestyle choices and visits with your health care provider that can promote health and wellness. What does preventive care include? A yearly physical exam. This is also called an annual well check. Dental exams once or twice a year. Routine eye exams. Ask your health care provider how often you should have your eyes checked. Personal lifestyle choices, including: Daily care of your teeth and gums. Regular physical activity. Eating a healthy diet. Avoiding tobacco and drug use. Limiting alcohol use. Practicing safe sex. Taking low doses of aspirin every day. Taking vitamin and mineral supplements as recommended by your health care provider. What happens during an annual well check? The services and screenings done by your health care provider during your annual well check will depend on your age, overall health,  lifestyle risk factors, and family history of disease. Counseling  Your health care provider may ask you questions about your: Alcohol use. Tobacco use. Drug use. Emotional well-being. Home and relationship well-being. Sexual activity. Eating habits. History of falls. Memory and ability to understand (cognition). Work and work Statistician. Screening  You may have the following tests or measurements: Height, weight, and BMI. Blood pressure. Lipid and cholesterol levels. These may be checked every 5 years, or more frequently if you are over 45 years old. Skin check. Lung cancer screening. You may have this screening every year starting at age 46 if you have a 30-pack-year history of smoking and currently smoke or have quit within the past 15 years. Fecal occult blood test (FOBT) of the stool. You may have this test every year starting at age 76. Flexible sigmoidoscopy or colonoscopy. You may have a sigmoidoscopy every 5 years or a colonoscopy every 10 years starting at age 36. Prostate cancer screening. Recommendations will vary depending on your family history and other risks. Hepatitis C blood test. Hepatitis B blood test. Sexually transmitted disease (STD) testing. Diabetes screening. This is done by checking your blood sugar (glucose) after you have not eaten for a while (fasting). You may have this done every 1-3 years. Abdominal aortic aneurysm (AAA) screening. You may need this if you are a current or former smoker. Osteoporosis. You may be screened starting at age 36 if you are at high  risk. Talk with your health care provider about your test results, treatment options, and if necessary, the need for more tests. Vaccines  Your health care provider may recommend certain vaccines, such as: Influenza vaccine. This is recommended every year. Tetanus, diphtheria, and acellular pertussis (Tdap, Td) vaccine. You may need a Td booster every 10 years. Zoster vaccine. You may need this  after age 57. Pneumococcal 13-valent conjugate (PCV13) vaccine. One dose is recommended after age 43. Pneumococcal polysaccharide (PPSV23) vaccine. One dose is recommended after age 57. Talk to your health care provider about which screenings and vaccines you need and how often you need them. This information is not intended to replace advice given to you by your health care provider. Make sure you discuss any questions you have with your health care provider. Document Released: 06/22/2015 Document Revised: 02/13/2016 Document Reviewed: 03/27/2015 Elsevier Interactive Patient Education  2017 [place] Prevention in the Home Falls can cause injuries. They can happen to people of all ages. There are many things you can do to make your home safe and to help prevent falls. What can I do on the outside of my home? Regularly fix the edges of walkways and driveways and fix any cracks. Remove anything that might make you trip as you walk through a door, such as a raised step or threshold. Trim any bushes or trees on the path to your home. Use bright outdoor lighting. Clear any walking paths of anything that might make someone trip, such as rocks or tools. Regularly check to see if handrails are loose or broken. Make sure that both sides of any steps have handrails. Any raised decks and porches should have guardrails on the edges. Have any leaves, snow, or ice cleared regularly. Use sand or salt on walking paths during winter. Clean up any spills in your garage right away. This includes oil or grease spills. What can I do in the bathroom? Use night lights. Install grab bars by the toilet and in the tub and shower. Do not use towel bars as grab bars. Use non-skid mats or decals in the tub or shower. If you need to sit down in the shower, use a plastic, non-slip stool. Keep the floor dry. Clean up any water that spills on the floor as soon as it happens. Remove soap buildup in the tub or  shower regularly. Attach bath mats securely with double-sided non-slip rug tape. Do not have throw rugs and other things on the floor that can make you trip. What can I do in the bedroom? Use night lights. Make sure that you have a light by your bed that is easy to reach. Do not use any sheets or blankets that are too big for your bed. They should not hang down onto the floor. Have a firm chair that has side arms. You can use this for support while you get dressed. Do not have throw rugs and other things on the floor that can make you trip. What can I do in the kitchen? Clean up any spills right away. Avoid walking on wet floors. Keep items that you use a lot in easy-to-reach places. If you need to reach something above you, use a strong step stool that has a grab bar. Keep electrical cords out of the way. Do not use floor polish or wax that makes floors slippery. If you must use wax, use non-skid floor wax. Do not have throw rugs and other things on the floor that can  make you trip. What can I do with my stairs? Do not leave any items on the stairs. Make sure that there are handrails on both sides of the stairs and use them. Fix handrails that are broken or loose. Make sure that handrails are as long as the stairways. Check any carpeting to make sure that it is firmly attached to the stairs. Fix any carpet that is loose or worn. Avoid having throw rugs at the top or bottom of the stairs. If you do have throw rugs, attach them to the floor with carpet tape. Make sure that you have a light switch at the top of the stairs and the bottom of the stairs. If you do not have them, ask someone to add them for you. What else can I do to help prevent falls? Wear shoes that: Do not have high heels. Have rubber bottoms. Are comfortable and fit you well. Are closed at the toe. Do not wear sandals. If you use a stepladder: Make sure that it is fully opened. Do not climb a closed stepladder. Make  sure that both sides of the stepladder are locked into place. Ask someone to hold it for you, if possible. Clearly mark and make sure that you can see: Any grab bars or handrails. First and last steps. Where the edge of each step is. Use tools that help you move around (mobility aids) if they are needed. These include: Canes. Walkers. Scooters. Crutches. Turn on the lights when you go into a dark area. Replace any light bulbs as soon as they burn out. Set up your furniture so you have a clear path. Avoid moving your furniture around. If any of your floors are uneven, fix them. If there are any pets around you, be aware of where they are. Review your medicines with your doctor. Some medicines can make you feel dizzy. This can increase your chance of falling. Ask your doctor what other things that you can do to help prevent falls. This information is not intended to replace advice given to you by your health care provider. Make sure you discuss any questions you have with your health care provider. Document Released: 03/22/2009 Document Revised: 11/01/2015 Document Reviewed: 06/30/2014 Elsevier Interactive Patient Education  2017 [employer].

## 2022-09-22 ENCOUNTER — Encounter: Payer: Self-pay | Admitting: *Deleted

## 2022-09-23 ENCOUNTER — Other Ambulatory Visit: Payer: Self-pay | Admitting: Family Medicine

## 2022-09-23 DIAGNOSIS — G8929 Other chronic pain: Secondary | ICD-10-CM

## 2022-10-05 ENCOUNTER — Other Ambulatory Visit: Payer: Self-pay | Admitting: Family Medicine

## 2022-10-05 DIAGNOSIS — E7800 Pure hypercholesterolemia, unspecified: Secondary | ICD-10-CM

## 2022-10-05 DIAGNOSIS — I10 Essential (primary) hypertension: Secondary | ICD-10-CM

## 2022-10-28 ENCOUNTER — Ambulatory Visit: Payer: PPO | Attending: Family Medicine | Admitting: Family Medicine

## 2022-10-28 ENCOUNTER — Encounter: Payer: Self-pay | Admitting: Family Medicine

## 2022-10-28 VITALS — BP 106/68 | HR 61 | Ht 69.0 in | Wt 149.4 lb

## 2022-10-28 DIAGNOSIS — G8929 Other chronic pain: Secondary | ICD-10-CM

## 2022-10-28 DIAGNOSIS — I10 Essential (primary) hypertension: Secondary | ICD-10-CM | POA: Diagnosis not present

## 2022-10-28 DIAGNOSIS — E7800 Pure hypercholesterolemia, unspecified: Secondary | ICD-10-CM

## 2022-10-28 DIAGNOSIS — Z125 Encounter for screening for malignant neoplasm of prostate: Secondary | ICD-10-CM | POA: Diagnosis not present

## 2022-10-28 DIAGNOSIS — M5441 Lumbago with sciatica, right side: Secondary | ICD-10-CM

## 2022-10-28 DIAGNOSIS — M5442 Lumbago with sciatica, left side: Secondary | ICD-10-CM

## 2022-10-28 MED ORDER — GABAPENTIN 300 MG PO CAPS: 300.0000 mg | ORAL_CAPSULE | Freq: Three times a day (TID) | ORAL | 1 refills | Status: DC

## 2022-10-28 MED ORDER — LOSARTAN POTASSIUM-HCTZ 100-25 MG PO TABS: 1.0000 | ORAL_TABLET | Freq: Every day | ORAL | 1 refills | Status: DC

## 2022-10-28 MED ORDER — ATORVASTATIN CALCIUM 40 MG PO TABS: 40.0000 mg | ORAL_TABLET | Freq: Every day | ORAL | 1 refills | Status: DC

## 2022-10-28 NOTE — Progress Notes (Signed)
Subjective:  Patient ID: Isaac Chan, male    DOB: January 17, 1956  Age: 67 y.o. MRN: [id number]  CC: Hypertension   HPI Isaac Chan is a 67 y.o. year old male with a history of nicotine dependence, hypertension, hyperlipidemia (6 cigarettes on and off per day x 30 years), THC use.  Interval History:  He has noticed tingling in his legs of recent. He does have chronic back pain and did receive an ESI in the past. This also occurs with some numbness rated as mild now but when he wakes up in the morning symptoms are moderate. He has no claudication pain. He is on Gabapentin.  Med list reveals he is on tizanidine however he discontinued taking it as he was of the opinion it was addictive. His back pain was previously controlled until a month ago and this affected his work. Endorses adherence with his antihypertensive and his statin. 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   Dizziness 05/23/2020   Dyspnea on exertion 05/23/2020   Essential hypertension 05/23/2020   Hyperlipidemia    Shoulder injury, left, initial encounter 07/02/2017   Smoking 05/23/2020    Past Surgical History:  Procedure Laterality Date   COLONOSCOPY     R wrist fx repaired     SHOULDER ARTHROSCOPY Left     Family History  Problem Relation Age of Onset   Prostate cancer Father    Colon cancer Neg Hx    Colon polyps Neg Hx    Esophageal cancer Neg Hx    Stomach cancer Neg Hx    Rectal cancer Neg Hx     Social History   Socioeconomic History   Marital status: Married    Spouse name: Not on file   Number of children: Not on file   Years of education: Not on file   Highest education level: Not on file  Occupational History   Not on file  Tobacco Use   Smoking status: Former    Packs/day: 0.25    Years: 46.00    Additional pack years: 0.00    Total pack years: 11.50    Types: Cigarettes    Quit date: 07/09/2021    Years since quitting: 1.3   Smokeless tobacco: Never   Vaping Use   Vaping Use: Never used  Substance and Sexual Activity   Alcohol use: Yes    Comment: once day-beer   Drug use: Yes    Types: Marijuana    Comment: daily, last was 10/02/21 this week   Sexual activity: Yes  Other Topics Concern   Not on file  Social History Narrative   Not on file   Social Determinants of Health   Financial Resource Strain: Low Risk  (08/18/2022)   Overall Financial Resource Strain (CARDIA)    Difficulty of Paying Living Expenses: Not hard at all  Food Insecurity: No Food Insecurity (08/18/2022)   Hunger Vital Sign    Worried About Running Out of Food in the Last Year: Never true    Ran Out of Food in the Last Year: Never true  Transportation Needs: No Transportation Needs (08/18/2022)   PRAPARE - Administrator, Civil Service (Medical): No    Lack of Transportation (Non-Medical): No  Physical Activity: Inactive (08/18/2022)   Exercise Vital Sign    Days of Exercise per Week: 0 days    Minutes of Exercise per Session: 0 min  Stress: No Stress Concern Present (08/18/2022)   [employer] of  Occupational Health - Occupational Stress Questionnaire    Feeling of Stress : Not at all  Social Connections: Socially Integrated (12/07/2020)   Social Connection and Isolation Panel [NHANES]    Frequency of Communication with Friends and Family: More than three times a week    Frequency of Social Gatherings with Friends and Family: More than three times a week    Attends Religious Services: More than 4 times per year    Active Member of [employer] or Organizations: Yes    Attends Engineer, structural: More than 4 times per year    Marital Status: Married    No Known Allergies  Outpatient Medications Prior to Visit  Medication Sig Dispense Refill   amLODipine (NORVASC) 10 MG tablet Take 1 tablet (10 mg total) by mouth daily. TAKE 1 TABLET(10 MG) BY MOUTH DAILY 90 tablet 2   spironolactone (ALDACTONE) 25 MG tablet Take 1 tablet (25 mg total)  by mouth daily. 90 tablet 2   atorvastatin (LIPITOR) 40 MG tablet Take 1 tablet (40 mg total) by mouth daily. 90 tablet 0   gabapentin (NEURONTIN) 300 MG capsule TAKE 1 CAPSULE(300 MG) BY MOUTH THREE TIMES DAILY 60 capsule 1   losartan-hydrochlorothiazide (HYZAAR) 100-25 MG tablet TAKE 1 TABLET BY MOUTH DAILY 90 tablet 0   oxyCODONE-acetaminophen (PERCOCET/ROXICET) 5-325 MG tablet Take 1 tablet by mouth every 6 (six) hours as needed for severe pain. 15 tablet 0   tiZANidine (ZANAFLEX) 4 MG tablet Take 1 tablet (4 mg total) by mouth every 6 (six) hours as needed for up to 15 doses for muscle spasms. 15 tablet 0   No facility-administered medications prior to visit.     ROS Review of Systems  Constitutional:  Negative for activity change and appetite change.  HENT:  Negative for sinus pressure and sore throat.   Respiratory:  Negative for chest tightness, shortness of breath and wheezing.   Cardiovascular:  Negative for chest pain and palpitations.  Gastrointestinal:  Negative for abdominal distention, abdominal pain and constipation.  Genitourinary: Negative.   Musculoskeletal:  Positive for back pain.  Psychiatric/Behavioral:  Negative for behavioral problems and dysphoric mood.     Objective:  BP 106/68   Pulse 61   Ht 5\' 9"  (1.753 m)   Wt 149 lb 6.4 oz (67.8 kg)   SpO2 99%   BMI 22.06 kg/m      10/28/2022    3:33 PM 08/18/2022    2:36 PM 07/16/2022    1:08 PM  BP/Weight  Systolic BP 106  782  Diastolic BP 68  72  Wt. (Lbs) 149.4 147 143  BMI 22.06 kg/m2 21.71 kg/m2 21.12 kg/m2      Physical Exam Constitutional:      Appearance: He is well-developed.  Cardiovascular:     Rate and Rhythm: Normal rate.     Heart sounds: Normal heart sounds. No murmur heard. Pulmonary:     Effort: Pulmonary effort is normal.     Breath sounds: Normal breath sounds. No wheezing or rales.  Chest:     Chest wall: No tenderness.  Abdominal:     General: Bowel sounds are normal. There  is no distension.     Palpations: Abdomen is soft. There is no mass.     Tenderness: There is no abdominal tenderness.  Musculoskeletal:     Right lower leg: No edema.     Left lower leg: No edema.     Comments: Decreased ROM of lumbar spine Negative straight  leg raise bilaterally  Neurological:     Mental Status: He is alert and oriented to person, place, and time.     Gait: Gait abnormal (flexion of spine during ambulation).  Psychiatric:        Mood and Affect: Mood normal.        Latest Ref Rng & Units 08/12/2021    2:19 PM 05/13/2021    9:17 AM 10/08/2020    9:05 AM  CMP  Glucose 70 - 99 mg/dL 91  191  478   BUN 8 - 27 mg/dL 22  14  26    Creatinine 0.76 - 1.27 mg/dL 2.95  6.21  3.08   Sodium 134 - 144 mmol/L 139  139  131   Potassium 3.5 - 5.2 mmol/L 4.5  4.6  4.4   Chloride 96 - 106 mmol/L 102  100  96   CO2 20 - 29 mmol/L 23  26  20    Calcium 8.6 - 10.2 mg/dL 9.7  9.7  9.2   Total Protein 6.0 - 8.5 g/dL 7.4  7.3    Total Bilirubin 0.0 - 1.2 mg/dL 0.3  0.3    Alkaline Phos 44 - 121 IU/L 45  50    AST 0 - 40 IU/L 28  29    ALT 0 - 44 IU/L 26  24      Lipid Panel     Component Value Date/Time   CHOL 175 10/16/2021 1356   TRIG 59 10/16/2021 1356   HDL 99 10/16/2021 1356   CHOLHDL 2.8 06/21/2020 0850   LDLCALC 64 10/16/2021 1356    CBC    Component Value Date/Time   WBC 4.9 05/13/2021 0917   WBC 5.0 05/17/2020 0806   RBC 4.45 05/13/2021 0917   RBC 5.88 (H) 05/17/2020 0806   HGB 12.0 (L) 05/13/2021 0917   HCT 34.9 (L) 05/13/2021 0917   PLT 213 05/13/2021 0917   MCV 78 (L) 05/13/2021 0917   MCH 27.0 05/13/2021 0917   MCH 27.6 05/17/2020 0806   MCHC 34.4 05/13/2021 0917   MCHC 36.1 (H) 05/17/2020 0806   RDW 15.1 05/13/2021 0917   LYMPHSABS 1.8 05/13/2021 0917   MONOABS 0.5 05/17/2020 0806   EOSABS 0.1 05/13/2021 0917   BASOSABS 0.0 05/13/2021 0917    No results found for: "HGBA1C"  Assessment & Plan:  1. Chronic bilateral low back pain with bilateral  sciatica Uncontrolled Restarted tizanidine Consider addition of Cymbalta if uncontrolled Will refer for Physical Therapy Advised to apply heat or ice whichever is tolerated to painful areas. Counseled on evidence of improvement in pain control with regards to yoga, water aerobics, massage, home physical therapy, exercise as tolerated.  - gabapentin (NEURONTIN) 300 MG capsule; Take 1 capsule (300 mg total) by mouth 3 (three) times daily.  Dispense: 270 capsule; Refill: 1 - Ambulatory referral to Physical Therapy  2. Pure hypercholesterolemia Controlled Low-cholesterol diet - atorvastatin (LIPITOR) 40 MG tablet; Take 1 tablet (40 mg total) by mouth daily.  Dispense: 90 tablet; Refill: 1 - CBC with Differential/Platelet - Vitamin B12  3. Essential hypertension Controlled Continue losartan/HCTZ, amlodipine, spironolactone Counseled on blood pressure goal of less than 130/80, low-sodium, DASH diet, medication compliance, 150 minutes of moderate intensity exercise per week. Discussed medication compliance, adverse effects. - losartan-hydrochlorothiazide (HYZAAR) 100-25 MG tablet; Take 1 tablet by mouth daily.  Dispense: 90 tablet; Refill: 1 - CMP14+EGFR  4. Screening for prostate cancer - PSA, total and free   Meds ordered this  encounter  Medications   gabapentin (NEURONTIN) 300 MG capsule    Sig: Take 1 capsule (300 mg total) by mouth 3 (three) times daily.    Dispense:  270 capsule    Refill:  1   atorvastatin (LIPITOR) 40 MG tablet    Sig: Take 1 tablet (40 mg total) by mouth daily.    Dispense:  90 tablet    Refill:  1   losartan-hydrochlorothiazide (HYZAAR) 100-25 MG tablet    Sig: Take 1 tablet by mouth daily.    Dispense:  90 tablet    Refill:  1    Follow-up: No follow-ups on file.       Hoy Register, MD, FAAFP. [facility] [place], [place] [phone]   10/28/2022, 4:05 PM

## 2022-10-28 NOTE — Patient Instructions (Signed)

## 2022-10-29 ENCOUNTER — Other Ambulatory Visit: Payer: Self-pay | Admitting: Family Medicine

## 2022-10-29 DIAGNOSIS — R972 Elevated prostate specific antigen [PSA]: Secondary | ICD-10-CM

## 2022-10-29 LAB — CBC WITH DIFFERENTIAL/PLATELET
Basophils Absolute: 0 10*3/uL (ref 0.0–0.2)
Basos: 1 %
EOS (ABSOLUTE): 0.1 10*3/uL (ref 0.0–0.4)
Eos: 3 %
Hematocrit: 34.7 % — ABNORMAL LOW (ref 37.5–51.0)
Hemoglobin: 11.5 g/dL — ABNORMAL LOW (ref 13.0–17.7)
Immature Grans (Abs): 0 10*3/uL (ref 0.0–0.1)
Immature Granulocytes: 0 %
Lymphocytes Absolute: 1.6 10*3/uL (ref 0.7–3.1)
Lymphs: 36 %
MCH: 26.3 pg — ABNORMAL LOW (ref 26.6–33.0)
MCHC: 33.1 g/dL (ref 31.5–35.7)
MCV: 79 fL (ref 79–97)
Monocytes Absolute: 0.6 10*3/uL (ref 0.1–0.9)
Monocytes: 14 %
Neutrophils Absolute: 1.9 10*3/uL (ref 1.4–7.0)
Neutrophils: 46 %
Platelets: 219 10*3/uL (ref 150–450)
RBC: 4.37 x10E6/uL (ref 4.14–5.80)
RDW: 15 % (ref 11.6–15.4)
WBC: 4.3 10*3/uL (ref 3.4–10.8)

## 2022-10-29 LAB — CMP14+EGFR
ALT: 21 IU/L (ref 0–44)
AST: 29 IU/L (ref 0–40)
Albumin/Globulin Ratio: 1.7 (ref 1.2–2.2)
Albumin: 4.4 g/dL (ref 3.9–4.9)
Alkaline Phosphatase: 60 IU/L (ref 44–121)
BUN/Creatinine Ratio: 19 (ref 10–24)
BUN: 22 mg/dL (ref 8–27)
Bilirubin Total: 0.3 mg/dL (ref 0.0–1.2)
CO2: 22 mmol/L (ref 20–29)
Calcium: 9.2 mg/dL (ref 8.6–10.2)
Chloride: 99 mmol/L (ref 96–106)
Creatinine, Ser: 1.18 mg/dL (ref 0.76–1.27)
Globulin, Total: 2.6 g/dL (ref 1.5–4.5)
Glucose: 82 mg/dL (ref 70–99)
Potassium: 4.4 mmol/L (ref 3.5–5.2)
Sodium: 135 mmol/L (ref 134–144)
Total Protein: 7 g/dL (ref 6.0–8.5)
eGFR: 68 mL/min/{1.73_m2} (ref >59)

## 2022-10-29 LAB — PSA, TOTAL AND FREE
PSA, Free Pct: 17.3 %
PSA, Free: 0.9 ng/mL
Prostate Specific Ag, Serum: 5.2 ng/mL — ABNORMAL HIGH (ref 0.0–4.0)

## 2022-10-29 LAB — VITAMIN B12: Vitamin B-12: 372 pg/mL (ref 232–1245)

## 2022-10-31 ENCOUNTER — Ambulatory Visit (INDEPENDENT_AMBULATORY_CARE_PROVIDER_SITE_OTHER): Payer: PPO | Admitting: Urology

## 2022-10-31 ENCOUNTER — Encounter: Payer: Self-pay | Admitting: Urology

## 2022-10-31 VITALS — BP 143/86 | HR 50 | Ht 69.0 in | Wt 149.0 lb

## 2022-10-31 DIAGNOSIS — R972 Elevated prostate specific antigen [PSA]: Secondary | ICD-10-CM | POA: Diagnosis not present

## 2022-10-31 DIAGNOSIS — N401 Enlarged prostate with lower urinary tract symptoms: Secondary | ICD-10-CM | POA: Diagnosis not present

## 2022-10-31 DIAGNOSIS — Z8042 Family history of malignant neoplasm of prostate: Secondary | ICD-10-CM | POA: Diagnosis not present

## 2022-10-31 DIAGNOSIS — N138 Other obstructive and reflux uropathy: Secondary | ICD-10-CM | POA: Diagnosis not present

## 2022-10-31 LAB — URINALYSIS, ROUTINE W REFLEX MICROSCOPIC
Bilirubin, UA: NEGATIVE
Glucose, UA: NEGATIVE
Ketones, UA: NEGATIVE
Leukocytes,UA: NEGATIVE
Nitrite, UA: NEGATIVE
Protein,UA: NEGATIVE
RBC, UA: NEGATIVE
Specific Gravity, UA: 1.025 (ref 1.005–1.030)
Urobilinogen, Ur: 0.2 mg/dL (ref 0.2–1.0)
pH, UA: 6.5 (ref 5.0–7.5)

## 2022-10-31 NOTE — Progress Notes (Signed)
Assessment: 1. Elevated PSA   2. Family history of prostate cancer in father   3. BPH with obstruction/lower urinary tract symptoms     Plan: I personally reviewed the patient's chart including provider notes, and lab results. Today I had a long discussion with the patient regarding PSA and the rationale and controversies of prostate cancer early detection.  I discussed the pros and cons of further evaluation including TRUS and prostate Bx.  Potential adverse events and complications as well as standard instructions were given.  Patient expressed his understanding of these issues. Schedule for TRUS/BX  Chief Complaint:  Chief Complaint  Patient presents with   Elevated PSA    History of Present Illness:  Isaac Chan is a 67 y.o. male who is seen in consultation from Hoy Register, MD for evaluation of elevated PSA.  PSA results: 5/23 1.4 5/24 5.2 with 17.3% free  No prior history of elevated PSAs.  No history of UTIs or prostatitis. He does have a family history of prostate cancer with his father. He has lower urinary tract symptoms including frequency, urgency, nocturia x 2 and an intermittent stream.  No dysuria or gross hematuria. IPSS = 15 today.   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   Dizziness 05/23/2020   Dyspnea on exertion 05/23/2020   Essential hypertension 05/23/2020   Hyperlipidemia    Shoulder injury, left, initial encounter 07/02/2017   Smoking 05/23/2020    Past Surgical History:  Past Surgical History:  Procedure Laterality Date   COLONOSCOPY     R wrist fx repaired     SHOULDER ARTHROSCOPY Left     Allergies:  No Known Allergies  Family History:  Family History  Problem Relation Age of Onset   Prostate cancer Father    Colon cancer Neg Hx    Colon polyps Neg Hx    Esophageal cancer Neg Hx    Stomach cancer Neg Hx    Rectal cancer Neg Hx     Social History:  Social History    Tobacco Use   Smoking status: Former    Packs/day: 0.25    Years: 46.00    Additional pack years: 0.00    Total pack years: 11.50    Types: Cigarettes    Quit date: 07/09/2021    Years since quitting: 1.3   Smokeless tobacco: Never  Vaping Use   Vaping Use: Never used  Substance Use Topics   Alcohol use: Yes    Comment: once day-beer   Drug use: Yes    Types: Marijuana    Comment: daily, last was 10/02/21 this week    Review of symptoms:  Constitutional:  Negative for unexplained weight loss, night sweats, fever, chills ENT:  Negative for nose bleeds, sinus pain, painful swallowing CV:  Negative for chest pain, shortness of breath, exercise intolerance, palpitations, loss of consciousness Resp:  Negative for cough, wheezing, shortness of breath GI:  Negative for nausea, vomiting, diarrhea, bloody stools GU:  Positives noted in HPI; otherwise negative for gross hematuria, dysuria, urinary incontinence Neuro:  Negative for seizures, poor balance, limb weakness, slurred speech Psych:  Negative for lack of energy, depression, anxiety Endocrine:  Negative for polydipsia, polyuria, symptoms of hypoglycemia (dizziness, hunger, sweating) Hematologic:  Negative for anemia, purpura, petechia, prolonged or excessive bleeding, use of anticoagulants  Allergic:  Negative for difficulty breathing or choking as a result of exposure to anything; no shellfish allergy; no allergic response (rash/itch)  to materials, foods  Physical exam: BP (!) 143/86   Pulse (!) 50   Ht 5\' 9"  (1.753 m)   Wt 149 lb (67.6 kg)   BMI 22.00 kg/m  GENERAL APPEARANCE:  Well appearing, well developed, well nourished, NAD HEENT: Atraumatic, Normocephalic, oropharynx clear. NECK: Supple without lymphadenopathy or thyromegaly. LUNGS: Clear to auscultation bilaterally. HEART: Regular Rate and Rhythm without murmurs, gallops, or rubs. ABDOMEN: Soft, non-tender, No Masses. EXTREMITIES: Moves all extremities well.   Without clubbing, cyanosis, or edema. NEUROLOGIC:  Alert and oriented x 3, normal gait, CN II-XII grossly intact.  MENTAL STATUS:  Appropriate. BACK:  Non-tender to palpation.  No CVAT SKIN:  Warm, dry and intact.   GU: Penis:  circumcised Meatus: Normal Scrotum: normal, no masses Testis: normal without masses bilateral Epididymis: normal Prostate: 50 gm, NT, no nodules Rectum: Normal tone,  no masses or tenderness   Results: U/A:  negative

## 2022-11-04 NOTE — Therapy (Signed)
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   Dizziness 05/23/2020   Dyspnea on exertion 05/23/2020   Essential hypertension 05/23/2020   Hyperlipidemia    Shoulder injury, left, initial encounter 07/02/2017   Smoking 05/23/2020   Past Surgical History:  Procedure Laterality Date   COLONOSCOPY     R wrist fx repaired     SHOULDER ARTHROSCOPY Left    Patient Active Problem List   Diagnosis Date Noted   Elevated PSA 10/31/2022   Family history of prostate cancer in father 10/31/2022   Dyslipidemia 07/16/2022   Facet arthropathy, lumbosacral 09/26/2021   Simple cyst of kidney 07/02/2021   Spinal stenosis, lumbar region, with neurogenic claudication 06/24/2021   Essential hypertension 05/23/2020   Dizziness 05/23/2020   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  NEXT MD VISIT: TBD   SUBJECTIVE:                                                                                                                                                                                                         SUBJECTIVE STATEMENT: Pt reports more numbness and tingling in B LE down to feet with only a little pain proximally. Back pain began a few years ago. Received an ESI ~8 months ago with considerable relief of pain for a while. ~3 weeks ago he started having  the numbness and tingling in B LE. Worst first thing in the morning, initially going away after a few hours but now constant.  PAIN: Are you having pain? Yes: NPRS scale: back pain - 1/10, tingling in LE - 3/10 Pain location: low back & B LE Pain description: nagging ache in low back with occasional sharp pinch, numbness and tingling In B LE to feet Aggravating factors: sitting for prolonged period, twisting motions Relieving factors: uncertain  PERTINENT HISTORY:  Lumbar spinal stenosis with neurogenic claudication, bradycardia, dizziness, DOE, HTN  PRECAUTIONS: None  WEIGHT BEARING RESTRICTIONS: No  FALLS:  Has patient fallen in last 6 months? No  LIVING ENVIRONMENT: Lives with: lives with their spouse Lives in: House/apartment Stairs: Yes: Internal: 9 steps; on left going up and External: 3 steps; on left going up Has following equipment at home:  walking stick  OCCUPATION: Part-time - carpentry, Web designer  PLOF: Independent and Leisure: mostly sedentary  PATIENT GOALS: "No pain."   OBJECTIVE: (objective measures completed at initial evaluation unless otherwise dated)  DIAGNOSTIC FINDINGS:  06/29/21 - Lumbar MRI:  IMPRESSION: 1. Moderate to severe multilevel spondylosis of the lumbar spine as described. 2. Moderate to severe central canal stenosis at L3-4 with crowding of nerve roots and bilateral subarticular stenosis. 3. Severe right and moderate left foraminal stenosis at  L3-4. 4. Moderate subarticular and severe foraminal narrowing bilaterally at L4-5. 5. Severe left and moderate right foraminal stenosis at L5-S1. 6. Mild subarticular and foraminal narrowing bilaterally at L2-3. 7. Mild left foraminal narrowing at T12-L1.  06/24/21 - Lumbar x-ray:  IMPRESSION:  Multilevel degenerative change without acute abnormality.  PATIENT SURVEYS:  Modified Oswestry 18 / 50 = 36.0 %  FOTO Lumbar spine = 54, predicted = 61  SCREENING FOR RED FLAGS: Bowel or bladder incontinence: Yes: urinary Spinal tumors: No Cauda equina syndrome: No Compression fracture: No Abdominal aneurysm: No  COGNITION:  Overall cognitive status: Within functional limits for tasks assessed    SENSATION: Constant numbness and tingling in B LE  MUSCLE LENGTH: Hamstrings: mild tight B ITB: WFL Piriformis: mild tight B Hip flexors: mild tight B Quads: mild tight B Heelcord: NT  POSTURE:  flexed trunk   PALPATION: Increased muscle tension in bilateral lumbar paraspinals with significant lumbar spine hypomobility.  LUMBAR ROM:  Tightness noted with most motions, but denies increased pain Active  eval  Flexion WFL  Extension 60% limited  Right lateral flexion WFL  Left lateral flexion WFL  Right rotation WFL  Left rotation WFL  (Blank rows = not tested)  LOWER EXTREMITY ROM:    B LE grossly WFL  LOWER EXTREMITY MMT:    MMT Right eval Left eval  Hip flexion 4+ 4  Hip extension 4- 4  Hip abduction 4- 4  Hip adduction 4+ 4+  Hip internal rotation 4 4  Hip external rotation 4+ 4  Knee flexion 4 4  Knee extension 4+ 4+  Ankle dorsiflexion 4 4  Ankle plantarflexion 4 4  Ankle inversion    Ankle eversion     (Blank rows = not tested)  LUMBAR SPECIAL TESTS:  Straight leg raise test: Negative, Slump test: Negative, and FABER test: Negative - Pt reports slump test positions seems to help alleviate pain/radicular symptoms   TODAY'S TREATMENT:    11/06/22 THERAPEUTIC EXERCISE: to improve flexibility, strength and mobility.  Verbal and tactile cues throughout for technique.  Initial eval POE 2 x 30" Prone press up x 5 Seated slump sciatic nerve glide  x 10 bil   PATIENT EDUCATION:  Education details: PT eval findings, anticipated POC, and initial HEP Person educated: Patient Education method: Explanation, Demonstration, Verbal cues, and Handouts Education comprehension: verbalized understanding, returned demonstration, and needs further education  HOME EXERCISE PROGRAM: Access Code: [email or web address] URL: [email or web address] Date: 11/06/2022 Prepared by: Glenetta Hew  Exercises - Seated Slump Nerve Glide  - 2-3 x daily - 7 x weekly - 2 sets - 10 reps - 3 sec hold - Static Prone on Elbows  - 2-3 x daily - 7 x weekly - 2 sets - 3 reps - 30-60 sec hold - Prone Press Up  - 2-3 x daily - 7 x weekly - 2 sets - 10 reps - 2 sec hold - Standing Lumbar Extension  - 2-3 x daily - 7 x weekly - 2 sets - 10 reps - 3 sec hold   ASSESSMENT:  CLINICAL IMPRESSION: Isaac Chan is a 67 y.o. male who was seen today for physical therapy evaluation and treatment for acute on chronic low back pain with acute bilateral LE sciatica.  Back pain originated greater than 10 years ago and has been intermittent but worsening over the past few years with new onset of bilateral LE numbness and tingling ~3 weeks ago.  No specific MOI but patient attributes back pain to positioning at work.  Pain currently exacerbated by prolonged sitting making it difficult to initiate movement upon rising.  Current deficits include postural abnormalities, mildly limited lumbar range of motion predominantly in extension, mildly limited proximal LE flexibility, and decreased core/lumbar and LE strength.  Pain and LE radicular symptoms limit positional tolerance for sitting standing and walking as well as significantly limits ability to lift and perform job activities.   Xin will benefit from skilled PT to address above deficits to improve mobility and activity tolerance with decreased pain interference.   OBJECTIVE IMPAIRMENTS: Abnormal gait, decreased activity tolerance, decreased endurance, decreased knowledge of condition, decreased mobility, difficulty walking, decreased ROM, decreased strength, increased fascial restrictions, impaired perceived functional ability, increased muscle spasms, impaired flexibility, improper body mechanics, postural dysfunction, and pain.   ACTIVITY LIMITATIONS: carrying, lifting, bending, sitting, standing, squatting, sleeping, transfers, bed mobility, continence, locomotion level, and caring for others  PARTICIPATION LIMITATIONS: meal prep, cleaning, laundry, driving, shopping, community activity, occupation, and yard work  PERSONAL FACTORS: Fitness, Past/current experiences, Time since onset of injury/illness/exacerbation, and 3+ comorbidities: Lumbar spinal stenosis with neurogenic claudication, bradycardia, dizziness, DOE, HTN  are also affecting patient's functional outcome.   REHAB POTENTIAL: Good  CLINICAL DECISION MAKING: Stable/uncomplicated  EVALUATION COMPLEXITY: Low   GOALS: Goals reviewed with patient? Yes  SHORT TERM GOALS: Target date: 11/27/2022  Patient will be independent with initial HEP to improve outcomes and carryover.  Baseline:  Goal status: INITIAL  2.  Patient will report centralization of radicular symptoms.  Baseline:  B LE numbness and tingling to feet Goal status: INITIAL  LONG TERM GOALS: Target date: 12/18/2022   Patient will be independent with ongoing/advanced HEP for self-management at home.  Baseline:  Goal status: INITIAL  2.  Patient will report 50-75% improvement in low back pain & B LE radiculopathy to improve QOL.  Baseline:  Goal status: INITIAL  3.  Patient to demonstrate ability to achieve and maintain good spinal alignment/posturing and body mechanics needed for  daily activities. Baseline:  Goal status: INITIAL  4.  Patient will demonstrate full pain free lumbar ROM to perform ADLs.   Baseline: limited lumbar extension ROM Goal status:  INITIAL  5.  Patient will demonstrate improved B LE strength to >/= 4+/5 for improved stability and ease of mobility . Baseline: refer to above MMT Goal status: INITIAL  6.  Patient will report >/= 61 on lumbar FOTO to demonstrate improved functional ability.  Baseline: 54 Goal status: INITIAL   7. Patient will report </= 24% on Modified Oswestry to demonstrate improved functional ability with decreased pain interference. Baseline: 18 / 50 = 36.0 % Goal status: INITIAL  8.  Patient to report ability to perform ADLs, household, and work-related tasks without limitation due to LBP or LE radiculopathy, LOM or weakness. Baseline: currently out of work due to pain Goal status: INITIAL   PLAN:  PT FREQUENCY: 2x/week  PT DURATION: 6 weeks  PLANNED INTERVENTIONS: Therapeutic exercises, Therapeutic activity, Neuromuscular re-education, Balance training, Gait training, Patient/Family education, Self Care, Joint mobilization, Aquatic Therapy, Dry Needling, Electrical stimulation, Spinal manipulation, Spinal mobilization, Cryotherapy, Moist heat, Taping, Traction, Ultrasound, Manual therapy, and Re-evaluation  PLAN FOR NEXT SESSION: review initial HEP; progress lumbar flexibility and lumbopelvic and LE strengthening; MT +/- DN as indicate to address lumbar hypomobility and increased muscle tension in lumbar paraspinals   Marry Guan, PT 11/06/2022, 1:55 PM

## 2022-11-06 ENCOUNTER — Other Ambulatory Visit: Payer: Self-pay

## 2022-11-06 ENCOUNTER — Encounter: Payer: Self-pay | Admitting: Physical Therapy

## 2022-11-06 ENCOUNTER — Ambulatory Visit: Payer: PPO | Attending: Family Medicine | Admitting: Physical Therapy

## 2022-11-06 DIAGNOSIS — M5442 Lumbago with sciatica, left side: Secondary | ICD-10-CM | POA: Diagnosis not present

## 2022-11-06 DIAGNOSIS — G8929 Other chronic pain: Secondary | ICD-10-CM | POA: Diagnosis not present

## 2022-11-06 DIAGNOSIS — M5416 Radiculopathy, lumbar region: Secondary | ICD-10-CM | POA: Diagnosis not present

## 2022-11-06 DIAGNOSIS — M6281 Muscle weakness (generalized): Secondary | ICD-10-CM | POA: Diagnosis not present

## 2022-11-06 DIAGNOSIS — M62830 Muscle spasm of back: Secondary | ICD-10-CM | POA: Diagnosis not present

## 2022-11-06 DIAGNOSIS — M5459 Other low back pain: Secondary | ICD-10-CM | POA: Diagnosis not present

## 2022-11-06 DIAGNOSIS — M5441 Lumbago with sciatica, right side: Secondary | ICD-10-CM | POA: Diagnosis not present

## 2022-11-12 ENCOUNTER — Encounter: Payer: Self-pay | Admitting: Physical Therapy

## 2022-11-12 ENCOUNTER — Ambulatory Visit: Payer: PPO | Attending: Family Medicine | Admitting: Physical Therapy

## 2022-11-12 DIAGNOSIS — M5459 Other low back pain: Secondary | ICD-10-CM | POA: Diagnosis not present

## 2022-11-12 DIAGNOSIS — M6281 Muscle weakness (generalized): Secondary | ICD-10-CM | POA: Insufficient documentation

## 2022-11-12 DIAGNOSIS — M5416 Radiculopathy, lumbar region: Secondary | ICD-10-CM | POA: Diagnosis not present

## 2022-11-12 DIAGNOSIS — M62830 Muscle spasm of back: Secondary | ICD-10-CM | POA: Insufficient documentation

## 2022-11-12 NOTE — Therapy (Signed)
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   Dizziness 05/23/2020   Dyspnea on exertion 05/23/2020   Essential hypertension 05/23/2020   Hyperlipidemia    Shoulder injury, left, initial encounter 07/02/2017   Smoking 05/23/2020   Past Surgical History:  Procedure Laterality Date   COLONOSCOPY     R wrist fx repaired     SHOULDER ARTHROSCOPY Left    Patient Active Problem List   Diagnosis Date Noted   Elevated PSA 10/31/2022   Family history of prostate cancer in father 10/31/2022   Dyslipidemia 07/16/2022   Facet arthropathy, lumbosacral 09/26/2021   Simple cyst of kidney 07/02/2021   Spinal stenosis, lumbar region, with neurogenic claudication 06/24/2021   Essential hypertension 05/23/2020   Dizziness 05/23/2020   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  NEXT MD VISIT:  TBD   SUBJECTIVE:                                                                                                                                                                                                         SUBJECTIVE STATEMENT: Pt reports his pain will tend to "grab" him while he is sitting. He has tried the HEP "a couple of days".  PAIN: Are you having pain? Yes: NPRS scale: back pain - 2/10, tingling in B LE - 2/10 Pain location: low back &  B LE to feet Pain description: nagging ache in low back with occasional sharp pinch, numbness and tingling In B LE to feet Aggravating factors: sitting for prolonged period, twisting motions Relieving factors: uncertain  PERTINENT HISTORY:  Lumbar spinal stenosis with neurogenic claudication, bradycardia, dizziness, DOE, HTN  PRECAUTIONS: None  WEIGHT BEARING RESTRICTIONS: No  FALLS:  Has patient fallen in last 6 months? No  LIVING ENVIRONMENT: Lives with: lives with their spouse Lives in: House/apartment Stairs: Yes: Internal: 9 steps; on left going up and External: 3 steps; on left going up Has following equipment at home:  walking stick  OCCUPATION: Part-time - carpentry, Web designer  PLOF: Independent and Leisure: mostly sedentary  PATIENT GOALS: "No pain."   OBJECTIVE: (objective measures completed at initial evaluation unless otherwise dated)  DIAGNOSTIC FINDINGS:  06/29/21 - Lumbar MRI:  IMPRESSION: 1. Moderate to severe multilevel spondylosis of the lumbar spine as described. 2. Moderate to severe central canal stenosis at L3-4 with crowding of nerve roots and bilateral subarticular stenosis. 3. Severe right and moderate left foraminal stenosis at L3-4. 4. Moderate subarticular and severe foraminal narrowing bilaterally at L4-5. 5. Severe left and moderate right foraminal stenosis at L5-S1. 6. Mild subarticular and foraminal narrowing bilaterally at L2-3. 7. Mild left foraminal narrowing at T12-L1.  06/24/21 -  Lumbar x-ray:  IMPRESSION:  Multilevel degenerative change without acute abnormality.  PATIENT SURVEYS:  Modified Oswestry 18 / 50 = 36.0 %  FOTO Lumbar spine = 54, predicted = 61  SCREENING FOR RED FLAGS: Bowel or bladder incontinence: Yes: urinary Spinal tumors: No Cauda equina syndrome: No Compression fracture: No Abdominal aneurysm: No  COGNITION:  Overall cognitive status: Within functional limits for tasks assessed    SENSATION: Constant numbness and tingling in B LE  MUSCLE LENGTH: Hamstrings: mild tight B ITB: WFL Piriformis: mild tight B Hip flexors: mild tight B Quads: mild tight B Heelcord: NT  POSTURE:  flexed trunk   PALPATION: Increased muscle tension in bilateral lumbar paraspinals with significant lumbar spine hypomobility.  LUMBAR ROM:  Tightness noted with most motions, but denies increased pain Active  eval  Flexion WFL  Extension 60% limited  Right lateral flexion WFL  Left lateral flexion WFL  Right rotation WFL  Left rotation WFL  (Blank rows = not tested)  LOWER EXTREMITY ROM:    B LE grossly WFL  LOWER EXTREMITY MMT:    MMT Right eval Left eval  Hip flexion 4+ 4  Hip extension 4- 4  Hip abduction 4- 4  Hip adduction 4+ 4+  Hip internal rotation 4 4  Hip external rotation 4+ 4  Knee flexion 4 4  Knee extension 4+ 4+  Ankle dorsiflexion 4 4  Ankle plantarflexion 4 4  Ankle inversion    Ankle eversion     (Blank rows = not tested)  LUMBAR SPECIAL TESTS:  Straight leg raise test: Negative, Slump test: Negative, and FABER test: Negative - Pt reports slump test positions seems to help alleviate pain/radicular symptoms   TODAY'S TREATMENT:   11/12/22 THERAPEUTIC EXERCISE: to improve flexibility, strength and mobility.  Verbal and tactile cues throughout for technique. Rec Bike - L2 x 6 min Seated slump sciatic nerve glide x 10 bil POE 3 x 30" Prone press up 2 x 5 Hooklying LTR 10 x 10" Hooklying TRA + GTB bent-knee fallout  10 x 3" Bridge + GTB hip ABD isometric 10 x 5"   MANUAL THERAPY: To promote normalized muscle tension, improved flexibility, improved joint  mobility, increased ROM, and reduced pain. Prone grade II-III lumbar CPAs & UPAs Skilled palpation and monitoring of soft tissue during DN Trigger Point Dry-Needling  Treatment instructions: Expect mild to moderate muscle soreness. S/S of pneumothorax if dry needled over a lung field, and to seek immediate medical attention should they occur. Patient verbalized understanding of these instructions and education. Patient Consent Given: Yes Education handout provided: Yes Muscles treated: B lumbar erector spinae & multifidi  Electrical stimulation performed: No Parameters: N/A Treatment response/outcome: Twitch Response Elicited and Palpable Increase in Muscle Length STM/DTM, manual TPR, IASTM with foam roller and pin & stretch to muscles addressed with DN   11/06/22 Initial eval THERAPEUTIC EXERCISE: to improve flexibility, strength and mobility.  Verbal and tactile cues throughout for technique.  POE 2 x 30" Prone press up x 5 Seated slump sciatic nerve glide x 10 bil   PATIENT EDUCATION:  Education details: HEP review, HEP update - lumbopelvic flexibility and strengthening, role of DN, and DN rational, procedure, outcomes, potential side effects, and recommended post-treatment exercises/activity  Person educated: Patient Education method: Explanation, Demonstration, Tactile cues, Verbal cues, and Handouts Education comprehension: verbalized understanding, returned demonstration, verbal cues required, tactile cues required, and needs further education  HOME EXERCISE PROGRAM: Access Code: [email or web address] URL: [email or web address] Date: 11/12/2022 Prepared by: Glenetta Hew  Exercises - Seated Slump Nerve Glide  - 2-3 x daily - 7 x weekly - 2 sets - 10 reps - 3 sec hold - Static Prone on Elbows  - 2-3 x daily - 7 x weekly - 2 sets - 3  reps - 30-60 sec hold - Prone Press Up  - 2-3 x daily - 7 x weekly - 2 sets - 10 reps - 2 sec hold - Standing Lumbar Extension  - 2-3 x daily - 7 x weekly - 2 sets - 10 reps - 3 sec hold - Supine Lower Trunk Rotation  - 2 x daily - 7 x weekly - 2 sets - 10 reps - 3 sec hold - Hooklying Single Leg Bent Knee Fallouts with Resistance  - 1 x daily - 7 x weekly - 2 sets - 10 reps - 3 sec hold - Supine Bridge with Resistance Band  - 1 x daily - 7 x weekly - 2 sets - 10 reps - 5 sec hold  Patient Education - Trigger Point Dry Needling  ASSESSMENT:  CLINICAL IMPRESSION: Aedyn notes uncertainty of benefit of HEP but admits to limited performance at home since eval. HEP review only requiring minor clarifications for proper technique and pt reporting improvement in pain with performance of exercises. Increased muscle tension still evident in lumbar paraspinals, L>R with taut bands in L erector spinae which appeared amenable to DN. After explanation of DN rational, procedures, outcomes and potential side effects, patient verbalized consent to DN treatment in conjunction with manual STM/DTM and TPR to reduce ttp/muscle tension. Muscles treated as indicated above. DN produced normal response with good twitches elicited resulting in palpable reduction in pain/ttp and muscle tension. Pt educated to expect mild to moderate muscle soreness for up to 24-48 hrs and instructed to continue prescribed HEP and current activity level with pt verbalizing understanding of these instructions. Progressed exercises to promote improved lumbopelvic flexibility and core strengthening with pt reporting no pain by end of session.   OBJECTIVE IMPAIRMENTS: Abnormal gait, decreased activity tolerance, decreased endurance, decreased knowledge of condition, decreased mobility, difficulty walking, decreased ROM, decreased strength, increased fascial restrictions, impaired perceived functional ability, increased  muscle spasms, impaired  flexibility, improper body mechanics, postural dysfunction, and pain.   ACTIVITY LIMITATIONS: carrying, lifting, bending, sitting, standing, squatting, sleeping, transfers, bed mobility, continence, locomotion level, and caring for others  PARTICIPATION LIMITATIONS: meal prep, cleaning, laundry, driving, shopping, community activity, occupation, and yard work  PERSONAL FACTORS: Fitness, Past/current experiences, Time since onset of injury/illness/exacerbation, and 3+ comorbidities: Lumbar spinal stenosis with neurogenic claudication, bradycardia, dizziness, DOE, HTN  are also affecting patient's functional outcome.   REHAB POTENTIAL: Good  CLINICAL DECISION MAKING: Stable/uncomplicated  EVALUATION COMPLEXITY: Low   GOALS: Goals reviewed with patient? Yes  SHORT TERM GOALS: Target date: 11/27/2022  Patient will be independent with initial HEP to improve outcomes and carryover.  Baseline:  Goal status: IN PROGRESS  2.  Patient will report centralization of radicular symptoms.  Baseline:  B LE numbness and tingling to feet Goal status: IN PROGRESS  LONG TERM GOALS: Target date: 12/18/2022   Patient will be independent with ongoing/advanced HEP for self-management at home.  Baseline:  Goal status: IN PROGRESS  2.  Patient will report 50-75% improvement in low back pain & B LE radiculopathy to improve QOL.  Baseline:  Goal status: IN PROGRESS  3.  Patient to demonstrate ability to achieve and maintain good spinal alignment/posturing and body mechanics needed for daily activities. Baseline:  Goal status: IN PROGRESS  4.  Patient will demonstrate full pain free lumbar ROM to perform ADLs.   Baseline: limited lumbar extension ROM Goal status: IN PROGRESS  5.  Patient will demonstrate improved B LE strength to >/= 4+/5 for improved stability and ease of mobility . Baseline: refer to above MMT Goal status: IN PROGRESS  6.  Patient will report >/= 61 on lumbar FOTO to demonstrate  improved functional ability.  Baseline: 54 Goal status: IN PROGRESS   7. Patient will report </= 24% on Modified Oswestry to demonstrate improved functional ability with decreased pain interference. Baseline: 18 / 50 = 36.0 % Goal status: IN PROGRESS  8.  Patient to report ability to perform ADLs, household, and work-related tasks without limitation due to LBP or LE radiculopathy, LOM or weakness. Baseline: currently out of work due to pain Goal status: IN PROGRESS   PLAN:  PT FREQUENCY: 2x/week  PT DURATION: 6 weeks  PLANNED INTERVENTIONS: Therapeutic exercises, Therapeutic activity, Neuromuscular re-education, Balance training, Gait training, Patient/Family education, Self Care, Joint mobilization, Aquatic Therapy, Dry Needling, Electrical stimulation, Spinal manipulation, Spinal mobilization, Cryotherapy, Moist heat, Taping, Traction, Ultrasound, Manual therapy, and Re-evaluation  PLAN FOR NEXT SESSION: Assess response to DN; progress lumbar flexibility and lumbopelvic and LE strengthening - review & update HEP PRN; MT +/- DN as indicate to address lumbar hypomobility and increased muscle tension in lumbar paraspinals   Marry Guan, PT 11/12/2022, 11:10 AM

## 2022-11-19 ENCOUNTER — Other Ambulatory Visit: Payer: Self-pay | Admitting: *Deleted

## 2022-11-19 ENCOUNTER — Ambulatory Visit: Payer: PPO | Admitting: Physical Therapy

## 2022-11-19 DIAGNOSIS — M47817 Spondylosis without myelopathy or radiculopathy, lumbosacral region: Secondary | ICD-10-CM

## 2022-11-19 DIAGNOSIS — M5416 Radiculopathy, lumbar region: Secondary | ICD-10-CM

## 2022-11-19 DIAGNOSIS — M62830 Muscle spasm of back: Secondary | ICD-10-CM

## 2022-11-19 DIAGNOSIS — M5459 Other low back pain: Secondary | ICD-10-CM

## 2022-11-19 DIAGNOSIS — M6281 Muscle weakness (generalized): Secondary | ICD-10-CM

## 2022-11-19 NOTE — Therapy (Signed)
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   Dizziness 05/23/2020   Dyspnea on exertion 05/23/2020   Essential hypertension 05/23/2020   Hyperlipidemia    Shoulder injury, left, initial encounter 07/02/2017   Smoking 05/23/2020   Past Surgical History:  Procedure Laterality Date   COLONOSCOPY     R wrist fx repaired     SHOULDER ARTHROSCOPY Left    Patient Active Problem List   Diagnosis Date Noted   Elevated PSA 10/31/2022   Family history of prostate cancer in father 10/31/2022   Dyslipidemia 07/16/2022   Facet arthropathy, lumbosacral 09/26/2021   Simple cyst of kidney 07/02/2021   Spinal stenosis, lumbar region, with neurogenic claudication 06/24/2021   Essential hypertension 05/23/2020   Dizziness 05/23/2020   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  NEXT MD  VISIT: TBD   SUBJECTIVE:                                                                                                                                                                                                         SUBJECTIVE STATEMENT: Pt reports he doesn't typically feel the tingling until he gets up on his feet in the morning, but back pain seems more constant. He notes benefit from the DN last visit but feels like this faded over time.  PAIN: Are you having  pain? Yes: NPRS scale: back pain & tingling in B LE - 3/10 Pain location: low back & B LE to feet Pain description: nagging ache in low back with occasional sharp pinch, numbness and tingling In B LE to feet Aggravating factors: sitting for prolonged period, twisting motions Relieving factors: uncertain  PERTINENT HISTORY:  Lumbar spinal stenosis with neurogenic claudication, bradycardia, dizziness, DOE, HTN  PRECAUTIONS: None  WEIGHT BEARING RESTRICTIONS: No  FALLS:  Has patient fallen in last 6 months? No  LIVING ENVIRONMENT: Lives with: lives with their spouse Lives in: House/apartment Stairs: Yes: Internal: 9 steps; on left going up and External: 3 steps; on left going up Has following equipment at home:  walking stick  OCCUPATION: Part-time - carpentry, Web designer  PLOF: Independent and Leisure: mostly sedentary  PATIENT GOALS: "No pain."   OBJECTIVE: (objective measures completed at initial evaluation unless otherwise dated)  DIAGNOSTIC FINDINGS:  06/29/21 - Lumbar MRI:  IMPRESSION: 1. Moderate to severe multilevel spondylosis of the lumbar spine as described. 2. Moderate to severe central canal stenosis at L3-4 with crowding of nerve roots and bilateral subarticular stenosis. 3. Severe right and moderate left foraminal stenosis at L3-4. 4. Moderate subarticular and severe foraminal narrowing bilaterally at L4-5. 5. Severe left and moderate right foraminal stenosis at L5-S1. 6. Mild subarticular  and foraminal narrowing bilaterally at L2-3. 7. Mild left foraminal narrowing at T12-L1.  06/24/21 - Lumbar x-ray:  IMPRESSION:  Multilevel degenerative change without acute abnormality.  PATIENT SURVEYS:  Modified Oswestry 18 / 50 = 36.0 %  FOTO Lumbar spine = 54, predicted = 61  SCREENING FOR RED FLAGS: Bowel or bladder incontinence: Yes: urinary Spinal tumors: No Cauda equina syndrome: No Compression fracture: No Abdominal aneurysm: No  COGNITION:  Overall cognitive status: Within functional limits for tasks assessed    SENSATION: Constant numbness and tingling in B LE  MUSCLE LENGTH: Hamstrings: mild tight B ITB: WFL Piriformis: mild tight B Hip flexors: mild tight B Quads: mild tight B Heelcord: NT  POSTURE:  flexed trunk   PALPATION: Increased muscle tension in bilateral lumbar paraspinals with significant lumbar spine hypomobility.  LUMBAR ROM:  Tightness noted with most motions, but denies increased pain Active  eval  Flexion WFL  Extension 60% limited  Right lateral flexion WFL  Left lateral flexion WFL  Right rotation WFL  Left rotation WFL  (Blank rows = not tested)  LOWER EXTREMITY ROM:    B LE grossly WFL  LOWER EXTREMITY MMT:    MMT Right eval Left eval  Hip flexion 4+ 4  Hip extension 4- 4  Hip abduction 4- 4  Hip adduction 4+ 4+  Hip internal rotation 4 4  Hip external rotation 4+ 4  Knee flexion 4 4  Knee extension 4+ 4+  Ankle dorsiflexion 4 4  Ankle plantarflexion 4 4  Ankle inversion    Ankle eversion     (Blank rows = not tested)  LUMBAR SPECIAL TESTS:  Straight leg raise test: Negative, Slump test: Negative, and FABER test: Negative - Pt reports slump test positions seems to help alleviate pain/radicular symptoms   TODAY'S TREATMENT:   11/19/22 THERAPEUTIC EXERCISE: to improve flexibility, strength and mobility.  Verbal and tactile cues throughout for technique. Rec Bike - L3 x 6 min POE 2 x 60" Prone press up 2 x  5 Hooklying LTR 5 x 10" - deferred d/t c/o increased pain Hooklying TRA + GTB bent-knee fallout 10 x 3" Bridge + GTB hip ABD isometric  10 x 5"  Seated slump sciatic nerve glide x 10 bil Quadruped: Cat/cow x 10 Alt UE raise x 10 Alt LE raise/hip extension x 10 Supine alt hip extension with pillow under pelvis x 10  MANUAL THERAPY: To promote normalized muscle tension, improved flexibility, and reduced pain.  Foam roller to lumbar paraspinals in prone   11/12/22 THERAPEUTIC EXERCISE: to improve flexibility, strength and mobility.  Verbal and tactile cues throughout for technique. Rec Bike - L2 x 6 min Seated slump sciatic nerve glide x 10 bil POE 3 x 30" Prone press up 2 x 5 Hooklying LTR 10 x 10" Hooklying TRA + GTB bent-knee fallout 10 x 3" Bridge + GTB hip ABD isometric 10 x 5"   MANUAL THERAPY: To promote normalized muscle tension, improved flexibility, improved joint mobility, increased ROM, and reduced pain. Prone grade II-III lumbar CPAs & UPAs Skilled palpation and monitoring of soft tissue during DN Trigger Point Dry-Needling  Treatment instructions: Expect mild to moderate muscle soreness. S/S of pneumothorax if dry needled over a lung field, and to seek immediate medical attention should they occur. Patient verbalized understanding of these instructions and education. Patient Consent Given: Yes Education handout provided: Yes Muscles treated: B lumbar erector spinae & multifidi  Electrical stimulation performed: No Parameters: N/A Treatment response/outcome: Twitch Response Elicited and Palpable Increase in Muscle Length STM/DTM, manual TPR, IASTM with foam roller and pin & stretch to muscles addressed with DN   11/06/22 Initial eval THERAPEUTIC EXERCISE: to improve flexibility, strength and mobility.  Verbal and tactile cues throughout for technique.  POE 2 x 30" Prone press up x 5 Seated slump sciatic nerve glide x 10 bil   PATIENT EDUCATION:  Education  details: HEP review and HEP modification - deferred LTR & added prone hip extension   Person educated: Patient Education method: Explanation, Demonstration, Tactile cues, Verbal cues, and Handouts Education comprehension: verbalized understanding, returned demonstration, verbal cues required, tactile cues required, and needs further education  HOME EXERCISE PROGRAM: Access Code: [email or web address] URL: [email or web address] Date: 11/19/2022 Prepared by: Glenetta Hew  Exercises - Seated Slump Nerve Glide  - 2-3 x daily - 7 x weekly - 2 sets - 10 reps - 3 sec hold - Static Prone on Elbows  - 2-3 x daily - 7 x weekly - 2 sets - 3 reps - 30-60 sec hold - Prone Press Up  - 2-3 x daily - 7 x weekly - 2 sets - 10 reps - 2 sec hold - Standing Lumbar Extension  - 2-3 x daily - 7 x weekly - 2 sets - 10 reps - 3 sec hold - Hooklying Single Leg Bent Knee Fallouts with Resistance  - 1 x daily - 7 x weekly - 2 sets - 10 reps - 3 sec hold - Supine Bridge with Resistance Band  - 1 x daily - 7 x weekly - 2 sets - 10 reps - 5 sec hold - Prone Hip Extension with Pillow Under Abdomen  - 1 x daily - 7 x weekly - 2 sets - 10 reps - 3 sec hold  Patient Education - Trigger Point Dry Needling  ASSESSMENT:  CLINICAL IMPRESSION: Isaac Chan reports selective performance of HEP, focusing on lumbar extension in standing and band exercises laying down, but states he does not typically try the remaining exercises as he thinks they may increase his pain.  HEP reviewed clarifying goal of centralization of pain with extension-based exercises, and explaining that back pain may slightly intensify initially  with this centralization.  All exercises well tolerated during review except hooklying LTR which was deferred from his HEP.  Given apparent extension preference with exercises, introduced quadruped and prone exercises working on core/trunk extensor flexibility and strengthening.  New exercises well-tolerated with pain  decreased to 1/10 and patient reporting some centralization of radicular symptoms by end of session.  HEP updated to include prone hip extension with pillow under abdomen/pelvis.  Patient encouraged to complete all exercises as long as pain not increasing, and to inform PT if exercises not well-tolerated at home.  Isaac Chan will continue to benefit from skilled PT to address ongoing abnormal muscle tension, flexibility and strength deficits to improve mobility and activity tolerance with decreased pain interference.   OBJECTIVE IMPAIRMENTS: Abnormal gait, decreased activity tolerance, decreased endurance, decreased knowledge of condition, decreased mobility, difficulty walking, decreased ROM, decreased strength, increased fascial restrictions, impaired perceived functional ability, increased muscle spasms, impaired flexibility, improper body mechanics, postural dysfunction, and pain.   ACTIVITY LIMITATIONS: carrying, lifting, bending, sitting, standing, squatting, sleeping, transfers, bed mobility, continence, locomotion level, and caring for others  PARTICIPATION LIMITATIONS: meal prep, cleaning, laundry, driving, shopping, community activity, occupation, and yard work  PERSONAL FACTORS: Fitness, Past/current experiences, Time since onset of injury/illness/exacerbation, and 3+ comorbidities: Lumbar spinal stenosis with neurogenic claudication, bradycardia, dizziness, DOE, HTN  are also affecting patient's functional outcome.   REHAB POTENTIAL: Good  CLINICAL DECISION MAKING: Stable/uncomplicated  EVALUATION COMPLEXITY: Low   GOALS: Goals reviewed with patient? Yes  SHORT TERM GOALS: Target date: 11/27/2022  Patient will be independent with initial HEP to improve outcomes and carryover.  Baseline:  Goal status: MET  11/19/22  2.  Patient will report centralization of radicular symptoms.  Baseline:  B LE numbness and tingling to feet Goal status: IN PROGRESS  11/19/22 - Pt noting some  centralization with extension based exercises  LONG TERM GOALS: Target date: 12/18/2022   Patient will be independent with ongoing/advanced HEP for self-management at home.  Baseline:  Goal status: IN PROGRESS  2.  Patient will report 50-75% improvement in low back pain & B LE radiculopathy to improve QOL.  Baseline:  Goal status: IN PROGRESS  3.  Patient to demonstrate ability to achieve and maintain good spinal alignment/posturing and body mechanics needed for daily activities. Baseline:  Goal status: IN PROGRESS  4.  Patient will demonstrate full pain free lumbar ROM to perform ADLs.   Baseline: limited lumbar extension ROM Goal status: IN PROGRESS  5.  Patient will demonstrate improved B LE strength to >/= 4+/5 for improved stability and ease of mobility . Baseline: refer to above MMT Goal status: IN PROGRESS  6.  Patient will report >/= 61 on lumbar FOTO to demonstrate improved functional ability.  Baseline: 54 Goal status: IN PROGRESS   7. Patient will report </= 24% on Modified Oswestry to demonstrate improved functional ability with decreased pain interference. Baseline: 18 / 50 = 36.0 % Goal status: IN PROGRESS  8.  Patient to report ability to perform ADLs, household, and work-related tasks without limitation due to LBP or LE radiculopathy, LOM or weakness. Baseline: currently out of work due to pain Goal status: IN PROGRESS   PLAN:  PT FREQUENCY: 2x/week  PT DURATION: 6 weeks  PLANNED INTERVENTIONS: Therapeutic exercises, Therapeutic activity, Neuromuscular re-education, Balance training, Gait training, Patient/Family education, Self Care, Joint mobilization, Aquatic Therapy, Dry Needling, Electrical stimulation, Spinal manipulation, Spinal mobilization, Cryotherapy, Moist heat, Taping, Traction, Ultrasound, Manual therapy, and Re-evaluation  PLAN FOR NEXT  SESSION: progress lumbar flexibility and lumbopelvic and LE strengthening with extension-based focus -  review & update HEP PRN; MT +/- DN as indicate to address lumbar hypomobility and increased muscle tension in lumbar paraspinals   Marry Guan, PT 11/19/2022, 11:12 AM

## 2022-11-20 ENCOUNTER — Ambulatory Visit: Payer: PPO | Admitting: Family Medicine

## 2022-11-20 ENCOUNTER — Encounter: Payer: Self-pay | Admitting: Family Medicine

## 2022-11-20 VITALS — BP 140/82 | Ht 69.0 in | Wt 149.0 lb

## 2022-11-20 DIAGNOSIS — M47817 Spondylosis without myelopathy or radiculopathy, lumbosacral region: Secondary | ICD-10-CM | POA: Diagnosis not present

## 2022-11-20 NOTE — Progress Notes (Signed)
PCP: Hoy Register, MD  Subjective:   HPI: Patient is a 67 y.o. male here for low back pain.  Patient reports he has done really well since having facet injections just over a year ago. Started bothering him again the past few months. Some tingling into both legs but not pain going down legs. No bowel/bladder dysfunction. Epidural did not help as much as the facet injections.  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 TAKE 1 TABLET(10 MG) BY MOUTH DAILY 90 tablet 2   atorvastatin (LIPITOR) 40 MG tablet Take 1 tablet (40 mg total) by mouth daily. 90 tablet 1   gabapentin (NEURONTIN) 300 MG capsule Take 1 capsule (300 mg total) by mouth 3 (three) times daily. 270 capsule 1   losartan-hydrochlorothiazide (HYZAAR) 100-25 MG tablet Take 1 tablet by mouth daily. 90 tablet 1   spironolactone (ALDACTONE) 25 MG tablet Take 1 tablet (25 mg total) by mouth daily. 90 tablet 2   tiZANidine (ZANAFLEX) 4 MG tablet Take 1 tablet (4 mg total) by mouth every 6 (six) hours as needed for up to 15 doses for muscle spasms. 15 tablet 0   No current facility-administered medications on file prior to visit.    Past Surgical History:  Procedure Laterality Date   COLONOSCOPY     R wrist fx repaired     SHOULDER ARTHROSCOPY Left     No Known Allergies  BP (!) 140/82 (BP Location: Left Arm, Patient Position: Sitting)   Ht 5\' 9"  (1.753 m)   Wt 149 lb (67.6 kg)   BMI 22.00 kg/m       No data to display              No data to display              Objective:  Physical Exam:  Gen: NAD, comfortable in exam  room  Back: No gross deformity, scoliosis. TTP minimally left lumbar paraspinal region.  No midline or bony TTP. FROM. Strength LEs 5/5 all muscle groups.   1+ MSRs in patellar and achilles tendons, equal bilaterally. Negative SLRs. Sensation intact to light touch bilaterally.   Assessment & Plan:  1. Low back pain - notable degenerative changes throughout his lumbar spine including spinal stenosis and facet arthropathy.  He didn't respond much to epidural injection but did to facet injections a year ago.  Will go ahead with repeating the latter.  He is in physical therapy and will continue with these.

## 2022-11-20 NOTE — Patient Instructions (Signed)
Continue your physical therapy. We will set you up for facet injections like you had last year.

## 2022-11-25 ENCOUNTER — Ambulatory Visit: Payer: PPO

## 2022-11-25 DIAGNOSIS — M6281 Muscle weakness (generalized): Secondary | ICD-10-CM

## 2022-11-25 DIAGNOSIS — M5459 Other low back pain: Secondary | ICD-10-CM | POA: Diagnosis not present

## 2022-11-25 DIAGNOSIS — M5416 Radiculopathy, lumbar region: Secondary | ICD-10-CM

## 2022-11-25 DIAGNOSIS — M62830 Muscle spasm of back: Secondary | ICD-10-CM

## 2022-11-25 NOTE — Therapy (Signed)
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   Dizziness 05/23/2020   Dyspnea on exertion 05/23/2020   Essential hypertension 05/23/2020   Hyperlipidemia    Shoulder injury, left, initial encounter 07/02/2017   Smoking 05/23/2020   Past Surgical History:  Procedure Laterality Date   COLONOSCOPY     R wrist fx repaired     SHOULDER ARTHROSCOPY Left    Patient Active Problem List   Diagnosis Date Noted   Elevated PSA 10/31/2022   Family history of prostate cancer in father 10/31/2022   Dyslipidemia 07/16/2022   Facet arthropathy, lumbosacral 09/26/2021   Simple cyst of kidney 07/02/2021   Spinal stenosis, lumbar region, with neurogenic claudication 06/24/2021   Essential hypertension 05/23/2020   Dizziness 05/23/2020   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  NEXT MD  VISIT: TBD   SUBJECTIVE:                                                                                                                                                                                                         SUBJECTIVE STATEMENT: Pt reports being scheduled for facet injections on Friday this week.   PAIN: Are you having pain? Yes: NPRS scale: back pain & tingling in B LE - 1/10 Pain location: low back & B LE to feet Pain description: nagging ache  in low back with occasional sharp pinch, numbness and tingling In B LE to feet Aggravating factors: sitting for prolonged period, twisting motions Relieving factors: uncertain  PERTINENT HISTORY:  Lumbar spinal stenosis with neurogenic claudication, bradycardia, dizziness, DOE, HTN  PRECAUTIONS: None  WEIGHT BEARING RESTRICTIONS: No  FALLS:  Has patient fallen in last 6 months? No  LIVING ENVIRONMENT: Lives with: lives with their spouse Lives in: House/apartment Stairs: Yes: Internal: 9 steps; on left going up and External: 3 steps; on left going up Has following equipment at home:  walking stick  OCCUPATION: Part-time - carpentry, Web designer  PLOF: Independent and Leisure: mostly sedentary  PATIENT GOALS: "No pain."   OBJECTIVE: (objective measures completed at initial evaluation unless otherwise dated)  DIAGNOSTIC FINDINGS:  06/29/21 - Lumbar MRI:  IMPRESSION: 1. Moderate to severe multilevel spondylosis of the lumbar spine as described. 2. Moderate to severe central canal stenosis at L3-4 with crowding of nerve roots and bilateral subarticular stenosis. 3. Severe right and moderate left foraminal stenosis at L3-4. 4. Moderate subarticular and severe foraminal narrowing bilaterally at L4-5. 5. Severe left and moderate right foraminal stenosis at L5-S1. 6. Mild subarticular and foraminal narrowing bilaterally at L2-3. 7. Mild left foraminal narrowing at T12-L1.  06/24/21 - Lumbar x-ray:  IMPRESSION:   Multilevel degenerative change without acute abnormality.  PATIENT SURVEYS:  Modified Oswestry 18 / 50 = 36.0 %  FOTO Lumbar spine = 54, predicted = 61  SCREENING FOR RED FLAGS: Bowel or bladder incontinence: Yes: urinary Spinal tumors: No Cauda equina syndrome: No Compression fracture: No Abdominal aneurysm: No  COGNITION:  Overall cognitive status: Within functional limits for tasks assessed    SENSATION: Constant numbness and tingling in B LE  MUSCLE LENGTH: Hamstrings: mild tight B ITB: WFL Piriformis: mild tight B Hip flexors: mild tight B Quads: mild tight B Heelcord: NT  POSTURE:  flexed trunk   PALPATION: Increased muscle tension in bilateral lumbar paraspinals with significant lumbar spine hypomobility.  LUMBAR ROM:  Tightness noted with most motions, but denies increased pain Active  eval  Flexion WFL  Extension 60% limited  Right lateral flexion WFL  Left lateral flexion WFL  Right rotation WFL  Left rotation WFL  (Blank rows = not tested)  LOWER EXTREMITY ROM:    B LE grossly WFL  LOWER EXTREMITY MMT:    MMT Right eval Left eval  Hip flexion 4+ 4  Hip extension 4- 4  Hip abduction 4- 4  Hip adduction 4+ 4+  Hip internal rotation 4 4  Hip external rotation 4+ 4  Knee flexion 4 4  Knee extension 4+ 4+  Ankle dorsiflexion 4 4  Ankle plantarflexion 4 4  Ankle inversion    Ankle eversion     (Blank rows = not tested)  LUMBAR SPECIAL TESTS:  Straight leg raise test: Negative, Slump test: Negative, and FABER test: Negative - Pt reports slump test positions seems to help alleviate pain/radicular symptoms   TODAY'S TREATMENT:  11/25/22 THERAPEUTIC EXERCISE: to improve flexibility, strength and mobility.  Verbal and tactile cues throughout for technique. UBE L2.0 3 min fwd/ 3 min back Supine B bent knee fallouts green TB 10x5" Supine single bent knee fallouts green TB 10x5" B Prone on elbows 3x30 sec Prone leg curls 2x10 B Prone press up  10x3" Quadruped cat/cow 10x3" Seated flexion stretch with green pball 2x30 sec Standing lumbar extension 10x3" Standing hip extension 5x  Standing hip abcution x 10 bil  11/19/22 THERAPEUTIC EXERCISE: to  improve flexibility, strength and mobility.  Verbal and tactile cues throughout for technique. Rec Bike - L3 x 6 min POE 2 x 60" Prone press up 2 x 5 Hooklying LTR 5 x 10" - deferred d/t c/o increased pain Hooklying TRA + GTB bent-knee fallout 10 x 3" Bridge + GTB hip ABD isometric 10 x 5"  Seated slump sciatic nerve glide x 10 bil Quadruped: Cat/cow x 10 Alt UE raise x 10 Alt LE raise/hip extension x 10 Supine alt hip extension with pillow under pelvis x 10  MANUAL THERAPY: To promote normalized muscle tension, improved flexibility, and reduced pain.  Foam roller to lumbar paraspinals in prone   11/12/22 THERAPEUTIC EXERCISE: to improve flexibility, strength and mobility.  Verbal and tactile cues throughout for technique. Rec Bike - L2 x 6 min Seated slump sciatic nerve glide x 10 bil POE 3 x 30" Prone press up 2 x 5 Hooklying LTR 10 x 10" Hooklying TRA + GTB bent-knee fallout 10 x 3" Bridge + GTB hip ABD isometric 10 x 5"   MANUAL THERAPY: To promote normalized muscle tension, improved flexibility, improved joint mobility, increased ROM, and reduced pain. Prone grade II-III lumbar CPAs & UPAs Skilled palpation and monitoring of soft tissue during DN Trigger Point Dry-Needling  Treatment instructions: Expect mild to moderate muscle soreness. S/S of pneumothorax if dry needled over a lung field, and to seek immediate medical attention should they occur. Patient verbalized understanding of these instructions and education. Patient Consent Given: Yes Education handout provided: Yes Muscles treated: B lumbar erector spinae & multifidi  Electrical stimulation performed: No Parameters: N/A Treatment response/outcome: Twitch Response Elicited and Palpable Increase in Muscle  Length STM/DTM, manual TPR, IASTM with foam roller and pin & stretch to muscles addressed with DN   11/06/22 Initial eval THERAPEUTIC EXERCISE: to improve flexibility, strength and mobility.  Verbal and tactile cues throughout for technique.  POE 2 x 30" Prone press up x 5 Seated slump sciatic nerve glide x 10 bil   PATIENT EDUCATION:  Education details: HEP review and HEP modification - deferred LTR & added prone hip extension   Person educated: Patient Education method: Explanation, Demonstration, Tactile cues, Verbal cues, and Handouts Education comprehension: verbalized understanding, returned demonstration, verbal cues required, tactile cues required, and needs further education  HOME EXERCISE PROGRAM: Access Code: [email or web address] URL: [email or web address] Date: 11/25/2022 Prepared by: Verta Ellen  Exercises - Seated Slump Nerve Glide  - 2-3 x daily - 7 x weekly - 2 sets - 10 reps - 3 sec hold - Static Prone on Elbows  - 2-3 x daily - 7 x weekly - 2 sets - 3 reps - 30-60 sec hold - Prone Press Up  - 2-3 x daily - 7 x weekly - 2 sets - 10 reps - 2 sec hold - Standing Lumbar Extension  - 2-3 x daily - 7 x weekly - 2 sets - 10 reps - 3 sec hold - Hooklying Single Leg Bent Knee Fallouts with Resistance  - 1 x daily - 7 x weekly - 2 sets - 10 reps - 3 sec hold - Supine Bridge with Resistance Band  - 1 x daily - 7 x weekly - 2 sets - 10 reps - 5 sec hold - Prone Hip Extension with Pillow Under Abdomen  - 1 x daily - 7 x weekly - 2 sets - 10 reps - 3 sec hold - Standing Hip Extension with Counter Support  - 1 x daily -  7 x weekly - 3 sets - 5 reps - Standing Hip Abduction with Counter Support  - 1 x daily - 7 x weekly - 3 sets - 10 reps  Patient Education - Trigger Point Dry Needling  CLINICAL IMPRESSION: Pt was able to complete all interventions today with no increased pain. Prone hip extension was giving him pain but the standing version was better. Min cues were  required to correct the exercises, however he is very flexed with gait and standing, he would benefit from more lumbar extension exercises as well as anterior hip stretching to improve posture.  OBJECTIVE IMPAIRMENTS: Abnormal gait, decreased activity tolerance, decreased endurance, decreased knowledge of condition, decreased mobility, difficulty walking, decreased ROM, decreased strength, increased fascial restrictions, impaired perceived functional ability, increased muscle spasms, impaired flexibility, improper body mechanics, postural dysfunction, and pain.   ACTIVITY LIMITATIONS: carrying, lifting, bending, sitting, standing, squatting, sleeping, transfers, bed mobility, continence, locomotion level, and caring for others  PARTICIPATION LIMITATIONS: meal prep, cleaning, laundry, driving, shopping, community activity, occupation, and yard work  PERSONAL FACTORS: Fitness, Past/current experiences, Time since onset of injury/illness/exacerbation, and 3+ comorbidities: Lumbar spinal stenosis with neurogenic claudication, bradycardia, dizziness, DOE, HTN  are also affecting patient's functional outcome.   REHAB POTENTIAL: Good  CLINICAL DECISION MAKING: Stable/uncomplicated  EVALUATION COMPLEXITY: Low   GOALS: Goals reviewed with patient? Yes  SHORT TERM GOALS: Target date: 11/27/2022  Patient will be independent with initial HEP to improve outcomes and carryover.  Baseline:  Goal status: MET  11/19/22  2.  Patient will report centralization of radicular symptoms.  Baseline:  B LE numbness and tingling to feet Goal status: IN PROGRESS  11/19/22 - Pt noting some centralization with extension based exercises  LONG TERM GOALS: Target date: 12/18/2022   Patient will be independent with ongoing/advanced HEP for self-management at home.  Baseline:  Goal status: IN PROGRESS  2.  Patient will report 50-75% improvement in low back pain & B LE radiculopathy to improve QOL.  Baseline:  Goal  status: IN PROGRESS  3.  Patient to demonstrate ability to achieve and maintain good spinal alignment/posturing and body mechanics needed for daily activities. Baseline:  Goal status: IN PROGRESS  4.  Patient will demonstrate full pain free lumbar ROM to perform ADLs.   Baseline: limited lumbar extension ROM Goal status: IN PROGRESS  5.  Patient will demonstrate improved B LE strength to >/= 4+/5 for improved stability and ease of mobility . Baseline: refer to above MMT Goal status: IN PROGRESS  6.  Patient will report >/= 61 on lumbar FOTO to demonstrate improved functional ability.  Baseline: 54 Goal status: IN PROGRESS   7. Patient will report </= 24% on Modified Oswestry to demonstrate improved functional ability with decreased pain interference. Baseline: 18 / 50 = 36.0 % Goal status: IN PROGRESS  8.  Patient to report ability to perform ADLs, household, and work-related tasks without limitation due to LBP or LE radiculopathy, LOM or weakness. Baseline: currently out of work due to pain Goal status: IN PROGRESS   PLAN:  PT FREQUENCY: 2x/week  PT DURATION: 6 weeks  PLANNED INTERVENTIONS: Therapeutic exercises, Therapeutic activity, Neuromuscular re-education, Balance training, Gait training, Patient/Family education, Self Care, Joint mobilization, Aquatic Therapy, Dry Needling, Electrical stimulation, Spinal manipulation, Spinal mobilization, Cryotherapy, Moist heat, Taping, Traction, Ultrasound, Manual therapy, and Re-evaluation  PLAN FOR NEXT SESSION: progress lumbar flexibility and lumbopelvic and LE strengthening with extension-based focus - review & update HEP PRN; MT +/- DN as indicate  to address lumbar hypomobility and increased muscle tension in lumbar paraspinals   Darleene Cleaver, PTA 11/25/2022, 2:06 PM

## 2022-11-26 ENCOUNTER — Ambulatory Visit (HOSPITAL_BASED_OUTPATIENT_CLINIC_OR_DEPARTMENT_OTHER)
Admission: RE | Admit: 2022-11-26 | Discharge: 2022-11-26 | Disposition: A | Discharge status: Home / Self Care | Payer: PPO | Source: Ambulatory Visit | Attending: Urology | Admitting: Urology

## 2022-11-26 ENCOUNTER — Ambulatory Visit (INDEPENDENT_AMBULATORY_CARE_PROVIDER_SITE_OTHER): Payer: PPO | Admitting: Urology

## 2022-11-26 ENCOUNTER — Encounter: Payer: PPO | Admitting: Physical Therapy

## 2022-11-26 ENCOUNTER — Encounter: Payer: Self-pay | Admitting: Urology

## 2022-11-26 VITALS — BP 146/87 | HR 50 | Ht 69.0 in | Wt 149.0 lb

## 2022-11-26 DIAGNOSIS — N4231 Prostatic intraepithelial neoplasia: Secondary | ICD-10-CM | POA: Insufficient documentation

## 2022-11-26 DIAGNOSIS — Z8042 Family history of malignant neoplasm of prostate: Secondary | ICD-10-CM | POA: Insufficient documentation

## 2022-11-26 DIAGNOSIS — R972 Elevated prostate specific antigen [PSA]: Secondary | ICD-10-CM | POA: Diagnosis not present

## 2022-11-26 DIAGNOSIS — Z2989 Encounter for other specified prophylactic measures: Secondary | ICD-10-CM | POA: Diagnosis not present

## 2022-11-26 MED ORDER — CEFTRIAXONE SODIUM 1 G IJ SOLR
1.0000 g | Freq: Once | INTRAMUSCULAR | Status: AC
Start: 2022-11-26 — End: 2022-11-26
  Administered 2022-11-26: 1 g via INTRAMUSCULAR

## 2022-11-26 NOTE — Progress Notes (Signed)
Assessment: 1. Elevated PSA   2. Family history of prostate cancer in father     Plan: Post biopsy instructions given Return to office in 7-10 days for biopsy results  Chief Complaint:  Chief Complaint  Patient presents with   Prostate Biopsy    History of Present Illness:  Isaac Chan is a 67 y.o. male who is seen for further evaluation of elevated PSA.  PSA results: 5/23 1.4 5/24 5.2 with 17.3% free  No prior history of elevated PSAs.  No history of UTIs or prostatitis. He does have a family history of prostate cancer with his father. He has lower urinary tract symptoms including frequency, urgency, nocturia x 2 and an intermittent stream.  No dysuria or gross hematuria. IPSS = 15.  He presents today for a prostate biopsy.  Portions of the above documentation were copied from a prior visit for review purposes only.  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   Dizziness 05/23/2020   Dyspnea on exertion 05/23/2020   Essential hypertension 05/23/2020   Hyperlipidemia    Shoulder injury, left, initial encounter 07/02/2017   Smoking 05/23/2020    Past Surgical History:  Past Surgical History:  Procedure Laterality Date   COLONOSCOPY     R wrist fx repaired     SHOULDER ARTHROSCOPY Left     Allergies:  No Known Allergies  Family History:  Family History  Problem Relation Age of Onset   Prostate cancer Father    Colon cancer Neg Hx    Colon polyps Neg Hx    Esophageal cancer Neg Hx    Stomach cancer Neg Hx    Rectal cancer Neg Hx     Social History:  Social History   Tobacco Use   Smoking status: Former    Packs/day: 0.25    Years: 46.00    Additional pack years: 0.00    Total pack years: 11.50    Types: Cigarettes    Quit date: 07/09/2021    Years since quitting: 1.3   Smokeless tobacco: Never  Vaping Use   Vaping Use: Never used  Substance Use Topics   Alcohol use: Yes    Comment: once  day-beer   Drug use: Yes    Types: Marijuana    Comment: daily, last was 10/02/21 this week    ROS: Constitutional:  Negative for fever, chills, weight loss CV: Negative for chest pain, previous MI, hypertension Respiratory:  Negative for shortness of breath, wheezing, sleep apnea, frequent cough GI:  Negative for nausea, vomiting, bloody stool, GERD  Physical exam: There were no vitals taken for this visit. GENERAL APPEARANCE:  Well appearing, well developed, well nourished, NAD HEENT:  Atraumatic, normocephalic, oropharynx clear NECK:  Supple without lymphadenopathy or thyromegaly ABDOMEN:  Soft, non-tender, no masses EXTREMITIES:  Moves all extremities well, without clubbing, cyanosis, or edema NEUROLOGIC:  Alert and oriented x 3, normal gait, CN II-XII grossly intact MENTAL STATUS:  appropriate BACK:  Non-tender to palpation, No CVAT SKIN:  Warm, dry, and intact   Results: U/A: negative  TRANSRECTAL ULTRASOUND AND PROSTATE BIOPSY  Indication:  Elevated PSA  Prophylactic antibiotic administration: Rocephin  All medications that could result in increased bleeding were discontinued within an appropriate period of the time of biopsy.  Risk including bleeding and infection were discussed.  Informed consent was obtained.  The patient was placed in the left lateral decubitus position.  PROCEDURE 1.  TRANSRECTAL ULTRASOUND OF THE  PROSTATE  The 7 MHz transrectal probe was used to image the prostate.  Anal stenosis was not noted.  TRUS volume: 34.7 ml  Hypoechoic areas: None  Hyperechoic areas: None  Central calcifications: not present  Margins:  normal  Seminal Vesicles: normal   PROCEDURE 2:  PROSTATE BIOPSY  A periprostatic block was performed using 1% lidocaine and transrectal ultrasound guidance. Under transrectal ultrasound guidance, and using the Biopty gun, prostate biopsies were obtained systematically from the apex, mid gland, and base bilaterally.  A  total of 12 cores were obtained.  Hemostasis was obtained with gentle pressure on the prostate.  The procedures were well-tolerated.  No significant bleeding was noted at the end of the procedure.  The patient was stable for discharge from the office.

## 2022-11-26 NOTE — Progress Notes (Signed)
IM Injection  Patient is present today for an IM Injection for treatment of infection prevention post prostate biopsy Drug: Ceftriaxone Dose:1g Location:Right upper outer buttocks Lot: [id number] Exp:08/2024 Patient tolerated well, no complications were noted  Performed by: Jennalyn Cawley A. CMA   

## 2022-11-27 LAB — URINALYSIS, ROUTINE W REFLEX MICROSCOPIC
Bilirubin, UA: NEGATIVE
Glucose, UA: NEGATIVE
Ketones, UA: NEGATIVE
Leukocytes,UA: NEGATIVE
Nitrite, UA: NEGATIVE
Protein,UA: NEGATIVE
RBC, UA: NEGATIVE
Specific Gravity, UA: 1.025 (ref 1.005–1.030)
Urobilinogen, Ur: 0.2 mg/dL (ref 0.2–1.0)
pH, UA: 5.5 (ref 5.0–7.5)

## 2022-11-27 NOTE — Discharge Instructions (Signed)

## 2022-11-28 ENCOUNTER — Ambulatory Visit
Admission: RE | Admit: 2022-11-28 | Discharge: 2022-11-28 | Disposition: A | Discharge status: Home / Self Care | Payer: PPO | Source: Ambulatory Visit | Attending: Family Medicine | Admitting: Family Medicine

## 2022-11-28 ENCOUNTER — Other Ambulatory Visit: Payer: Self-pay | Admitting: Family Medicine

## 2022-11-28 DIAGNOSIS — M47817 Spondylosis without myelopathy or radiculopathy, lumbosacral region: Secondary | ICD-10-CM

## 2022-11-28 DIAGNOSIS — M5459 Other low back pain: Secondary | ICD-10-CM | POA: Diagnosis not present

## 2022-11-28 MED ORDER — METHYLPREDNISOLONE ACETATE 40 MG/ML INJ SUSP (RADIOLOG
80.0000 mg | Freq: Once | INTRAMUSCULAR | Status: AC
  Administered 2022-11-28: 80 mg via INTRA_ARTICULAR

## 2022-11-28 MED ORDER — IOPAMIDOL (ISOVUE-M 200) INJECTION 41%
1.0000 mL | Freq: Once | INTRAMUSCULAR | Status: AC
  Administered 2022-11-28: 1 mL via INTRA_ARTICULAR

## 2022-12-01 ENCOUNTER — Encounter: Payer: Self-pay | Admitting: Physical Therapy

## 2022-12-01 ENCOUNTER — Ambulatory Visit: Payer: PPO | Admitting: Physical Therapy

## 2022-12-01 DIAGNOSIS — M5416 Radiculopathy, lumbar region: Secondary | ICD-10-CM

## 2022-12-01 DIAGNOSIS — M5459 Other low back pain: Secondary | ICD-10-CM

## 2022-12-01 DIAGNOSIS — M6281 Muscle weakness (generalized): Secondary | ICD-10-CM

## 2022-12-01 DIAGNOSIS — M62830 Muscle spasm of back: Secondary | ICD-10-CM

## 2022-12-01 NOTE — Therapy (Addendum)
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   Dizziness 05/23/2020   Dyspnea on exertion 05/23/2020   Essential hypertension 05/23/2020   Hyperlipidemia    Shoulder injury, left, initial encounter 07/02/2017   Smoking 05/23/2020   Past Surgical History:  Procedure Laterality Date   COLONOSCOPY     R wrist fx repaired     SHOULDER ARTHROSCOPY Left    Patient Active Problem List   Diagnosis Date Noted   Elevated PSA 10/31/2022   Family history of prostate cancer in father 10/31/2022   Dyslipidemia 07/16/2022   Facet arthropathy, lumbosacral 09/26/2021   Simple cyst of kidney 07/02/2021   Spinal stenosis, lumbar region, with neurogenic claudication 06/24/2021   Essential hypertension 05/23/2020   Dizziness 05/23/2020   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  NEXT MD  VISIT: TBD   SUBJECTIVE:                                                                                                                                                                                                         SUBJECTIVE STATEMENT: Pt reports he got his shot in his back on Friday. Able to do HEP w/o pain.  PAIN: Are you having pain? Yes: NPRS scale: 1/10 Pain location: B LE to calves Pain description: tingling  Aggravating factors: sitting for prolonged period,  twisting motions Relieving factors: uncertain  PERTINENT HISTORY:  Lumbar spinal stenosis with neurogenic claudication, bradycardia, dizziness, DOE, HTN  PRECAUTIONS: None  WEIGHT BEARING RESTRICTIONS: No  FALLS:  Has patient fallen in last 6 months? No  LIVING ENVIRONMENT: Lives with: lives with their spouse Lives in: House/apartment Stairs: Yes: Internal: 9 steps; on left going up and External: 3 steps; on left going up Has following equipment at home:  walking stick  OCCUPATION: Part-time - carpentry, Web designer  PLOF: Independent and Leisure: mostly sedentary  PATIENT GOALS: "No pain."   OBJECTIVE: (objective measures completed at initial evaluation unless otherwise dated)  DIAGNOSTIC FINDINGS:  06/29/21 - Lumbar MRI:  IMPRESSION: 1. Moderate to severe multilevel spondylosis of the lumbar spine as described. 2. Moderate to severe central canal stenosis at L3-4 with crowding of nerve roots and bilateral subarticular stenosis. 3. Severe right and moderate left foraminal stenosis at L3-4. 4. Moderate subarticular and severe foraminal narrowing bilaterally at L4-5. 5. Severe left and moderate right foraminal stenosis at L5-S1. 6. Mild subarticular and foraminal narrowing bilaterally at L2-3. 7. Mild left foraminal narrowing at T12-L1.  06/24/21 - Lumbar x-ray:  IMPRESSION:  Multilevel degenerative change without acute abnormality.  PATIENT SURVEYS:  Modified Oswestry 18 / 50 = 36.0 %  FOTO  Lumbar spine = 54, predicted = 61  SCREENING FOR RED FLAGS: Bowel or bladder incontinence: Yes: urinary Spinal tumors: No Cauda equina syndrome: No Compression fracture: No Abdominal aneurysm: No  COGNITION:  Overall cognitive status: Within functional limits for tasks assessed    SENSATION: Constant numbness and tingling in B LE  MUSCLE LENGTH: Hamstrings: mild tight B ITB: WFL Piriformis: mild tight B Hip flexors: mild tight B Quads: mild tight B Heelcord: NT  POSTURE:  flexed trunk   PALPATION: Increased muscle tension in bilateral lumbar paraspinals with significant lumbar spine hypomobility.  LUMBAR ROM:  Tightness noted with most motions, but denies increased pain Active  eval  Flexion WFL  Extension 60% limited  Right lateral flexion WFL  Left lateral flexion WFL  Right rotation WFL  Left rotation WFL  (Blank rows = not tested)  LOWER EXTREMITY ROM:    B LE grossly WFL  LOWER EXTREMITY MMT:    MMT Right eval Left eval  Hip flexion 4+ 4  Hip extension 4- 4  Hip abduction 4- 4  Hip adduction 4+ 4+  Hip internal rotation 4 4  Hip external rotation 4+ 4  Knee flexion 4 4  Knee extension 4+ 4+  Ankle dorsiflexion 4 4  Ankle plantarflexion 4 4  Ankle inversion    Ankle eversion     (Blank rows = not tested)  LUMBAR SPECIAL TESTS:  Straight leg raise test: Negative, Slump test: Negative, and FABER test: Negative - Pt reports slump test positions seems to help alleviate pain/radicular symptoms   TODAY'S TREATMENT:   12/01/22 THERAPEUTIC EXERCISE: to improve flexibility, strength and mobility.  Demonstration, verbal and tactile cues throughout for technique.  NuStep - L5 x 6 min Standing alt hip ABD x 10 w/o added resistance, x 10 with looped GTB just below knees Standing alt hip ABD x 10 w/o added resistance, x 10 with looped GTB just below knees Standing TrA + GTB rows 10 x 3" Standing TrA + GTB shoulder extension/scap retraction 10 x  3" Standing B GTB pallof press x 10 each side Standing B GTB multifidi walk-out with hands at chest x 10 Standing lunge position hip flexor stretch 2 x 30" bil  Standing lumbar extension (buttocks resting against counter) 10 x 3-5"   11/25/22 THERAPEUTIC EXERCISE: to improve flexibility, strength and mobility.  Verbal and tactile cues throughout for technique. UBE L2.0 3 min fwd/ 3 min back Supine B bent knee fallouts green TB 10x5" Supine single bent knee fallouts green TB 10x5" B Prone on elbows 3x30 sec Prone leg curls 2x10 B Prone press up 10x3" Quadruped cat/cow 10x3" Seated flexion stretch with green pball 2x30 sec Standing lumbar extension 10x3" Standing hip extension 5x  Standing hip abduction x 10 bil   11/19/22 THERAPEUTIC EXERCISE: to improve flexibility, strength and mobility.  Verbal and tactile cues throughout for technique. Rec Bike - L3 x 6 min POE 2 x 60" Prone press up 2 x 5 Hooklying LTR 5 x 10" - deferred d/t c/o increased pain Hooklying TRA + GTB bent-knee fallout 10 x 3" Bridge + GTB hip ABD isometric 10 x 5"  Seated slump sciatic nerve glide x 10 bil Quadruped: Cat/cow x 10 Alt UE raise x 10 Alt LE raise/hip extension x 10 Supine alt hip extension with pillow under pelvis x 10  MANUAL THERAPY: To promote normalized muscle tension, improved flexibility, and reduced pain.  Foam roller to lumbar paraspinals in prone   PATIENT EDUCATION:  Education details: HEP review and HEP modification - deferred LTR & added prone hip extension   Person educated: Patient Education method: Explanation, Demonstration, Tactile cues, Verbal cues, and Handouts Education comprehension: verbalized understanding, returned demonstration, verbal cues required, tactile cues required, and needs further education  HOME EXERCISE PROGRAM: Access Code: [email or web address] URL: [email or web address] Date: 11/25/2022 Prepared by: Verta Ellen  Exercises - Seated Slump Nerve  Glide  - 2-3 x daily - 7 x weekly - 2 sets - 10 reps - 3 sec hold - Static Prone on Elbows  - 2-3 x daily - 7 x weekly - 2 sets - 3 reps - 30-60 sec hold - Prone Press Up  - 2-3 x daily - 7 x weekly - 2 sets - 10 reps - 2 sec hold - Standing Lumbar Extension  - 2-3 x daily - 7 x weekly - 2 sets - 10 reps - 3 sec hold - Hooklying Single Leg Bent Knee Fallouts with Resistance  - 1 x daily - 7 x weekly - 2 sets - 10 reps - 3 sec hold - Supine Bridge with Resistance Band  - 1 x daily - 7 x weekly - 2 sets - 10 reps - 5 sec hold - Prone Hip Extension with Pillow Under Abdomen  - 1 x daily - 7 x weekly - 2 sets - 10 reps - 3 sec hold - Standing Hip Extension with Counter Support  - 1 x daily - 7 x weekly - 3 sets - 5 reps - Standing Hip Abduction with Counter Support  - 1 x daily - 7 x weekly - 3 sets - 10 reps  Patient Education - Trigger Point Dry Needling  CLINICAL IMPRESSION: Xhaiden reports pain improving overall with decreasing intensity and centralization of radicular tingling in B LE with symptoms now no longer reaching B feet - STG #2 met. He reports he is able to complete his HEP w/o any increased pain or radicular symptoms noted. Progressed standing hip strengthening with addition of looped GTB resistance just below knees with pt provided guidance on self-progression of resistance by moving the resistance band more distally on LE as tolerated. Introduced UE resisted core strengthening to promote more upright posture, including  standing rows/extension, pallof presses and multifidi walkouts with good tolerance. Estanislado will continue to benefit from skilled PT to address ongoing posture, flexibility, ROM and strength deficits to improve mobility and activity tolerance with decreased pain interference.  Discussed adding additional visits through end of POC but pt hopeful to be able to transition to HEP by last scheduled visit on 12/08/22.  OBJECTIVE IMPAIRMENTS: Abnormal gait, decreased activity  tolerance, decreased endurance, decreased knowledge of condition, decreased mobility, difficulty walking, decreased ROM, decreased strength, increased fascial restrictions, impaired perceived functional ability, increased muscle spasms, impaired flexibility, improper body mechanics, postural dysfunction, and pain.   ACTIVITY LIMITATIONS: carrying, lifting, bending, sitting, standing, squatting, sleeping, transfers, bed mobility, continence, locomotion level, and caring for others  PARTICIPATION LIMITATIONS: meal prep, cleaning, laundry, driving, shopping, community activity, occupation, and yard work  PERSONAL FACTORS: Fitness, Past/current experiences, Time since onset of injury/illness/exacerbation, and 3+ comorbidities: Lumbar spinal stenosis with neurogenic claudication, bradycardia, dizziness, DOE, HTN  are also affecting patient's functional outcome.   REHAB POTENTIAL: Good  CLINICAL DECISION MAKING: Stable/uncomplicated  EVALUATION COMPLEXITY: Low   GOALS: Goals reviewed with patient? Yes  SHORT TERM GOALS: Target date: 11/27/2022  Patient will be independent with initial HEP to improve outcomes and carryover.  Baseline:  Goal status: MET  11/19/22  2.  Patient will report centralization of radicular symptoms.  Baseline:  B LE numbness and tingling to feet Goal status: MET  12/01/22 - Pt reporting tingling less intense and no longer goes past his ankles but still into calves at times  LONG TERM GOALS: Target date: 12/18/2022   Patient will be independent with ongoing/advanced HEP for self-management at home.  Baseline:  Goal status: IN PROGRESS  2.  Patient will report 50-75% improvement in low back pain & B LE radiculopathy to improve QOL.  Baseline:  Goal status: IN PROGRESS  3.  Patient to demonstrate ability to achieve and maintain good spinal alignment/posturing and body mechanics needed for daily activities. Baseline:  Goal status: IN PROGRESS  4.  Patient will  demonstrate full pain free lumbar ROM to perform ADLs.   Baseline: limited lumbar extension ROM Goal status: IN PROGRESS  5.  Patient will demonstrate improved B LE strength to >/= 4+/5 for improved stability and ease of mobility . Baseline: refer to above MMT Goal status: IN PROGRESS  6.  Patient will report >/= 61 on lumbar FOTO to demonstrate improved functional ability.  Baseline: 54 Goal status: IN PROGRESS   7. Patient will report </= 24% on Modified Oswestry to demonstrate improved functional ability with decreased pain interference. Baseline: 18 / 50 = 36.0 % Goal status: IN PROGRESS  8.  Patient to report ability to perform ADLs, household, and work-related tasks without limitation due to LBP or LE radiculopathy, LOM or weakness. Baseline: currently out of work due to pain Goal status: IN PROGRESS   PLAN:  PT FREQUENCY: 2x/week  PT DURATION: 6 weeks  PLANNED INTERVENTIONS: Therapeutic exercises, Therapeutic activity, Neuromuscular re-education, Balance training, Gait training, Patient/Family education, Self Care, Joint mobilization, Aquatic Therapy, Dry Needling, Electrical stimulation, Spinal manipulation, Spinal mobilization, Cryotherapy, Moist heat, Taping, Traction, Ultrasound, Manual therapy, and Re-evaluation  PLAN FOR NEXT SESSION: progress lumbar flexibility and lumbopelvic and LE strengthening with extension-based focus - review & update HEP in prep for anticipated transition to HEP on 12/08/22; MT +/- DN as indicate to address lumbar hypomobility and increased muscle tension in lumbar paraspinals   Marry Guan, PT 12/01/2022, 11:04 AM

## 2022-12-03 ENCOUNTER — Ambulatory Visit (INDEPENDENT_AMBULATORY_CARE_PROVIDER_SITE_OTHER): Payer: PPO | Admitting: Urology

## 2022-12-03 ENCOUNTER — Encounter: Payer: Self-pay | Admitting: Urology

## 2022-12-03 VITALS — BP 129/80 | HR 55 | Ht 69.0 in | Wt 149.0 lb

## 2022-12-03 DIAGNOSIS — R972 Elevated prostate specific antigen [PSA]: Secondary | ICD-10-CM

## 2022-12-03 DIAGNOSIS — N138 Other obstructive and reflux uropathy: Secondary | ICD-10-CM | POA: Diagnosis not present

## 2022-12-03 DIAGNOSIS — N401 Enlarged prostate with lower urinary tract symptoms: Secondary | ICD-10-CM

## 2022-12-03 DIAGNOSIS — Z8042 Family history of malignant neoplasm of prostate: Secondary | ICD-10-CM

## 2022-12-03 LAB — URINALYSIS, ROUTINE W REFLEX MICROSCOPIC
Bilirubin, UA: NEGATIVE
Glucose, UA: NEGATIVE
Nitrite, UA: NEGATIVE
Protein,UA: NEGATIVE
Specific Gravity, UA: 1.025 (ref 1.005–1.030)
Urobilinogen, Ur: 0.2 mg/dL (ref 0.2–1.0)
pH, UA: 5.5 (ref 5.0–7.5)

## 2022-12-03 LAB — MICROSCOPIC EXAMINATION
Crystal Type: NONE SEEN
Crystals: NONE SEEN
RBC, Urine: >30 /hpf — AB (ref 0–2)
Trichomonas, UA: NONE SEEN
Yeast, UA: NONE SEEN

## 2022-12-03 NOTE — Progress Notes (Signed)
Assessment: 1. Elevated PSA; negative biopsy 6/24   2. Family history of prostate cancer in father   3. BPH with obstruction/lower urinary tract symptoms     Plan: I discussed the biopsy results with [name] today.  Fortunately, there was no evidence of prostate cancer.  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 Recommend continued monitoring of PSA at this time. Return to office in 6 months for PSA. I discussed the potential role of additional testing with urine biomarkers, prostate MRI, and repeat prostate biopsy.  Chief Complaint:  Chief Complaint  Patient presents with   Results    History of Present Illness:  Isaac Chan is a 67 y.o. male who is seen for discussion of recent prostate biopsy results. He was recently found to have an elevated PSA. PSA results: 5/23 1.4 5/24 5.2 with 17.3% free  No prior history of elevated PSAs.  No history of UTIs or prostatitis. He does have a family history of prostate cancer with his father. He has lower urinary tract symptoms including frequency, urgency, nocturia x 2 and an intermittent stream.  No dysuria or gross hematuria. IPSS = 15.  He returns today following his transrectal ultrasound and biopsy of the prostate. TRUS volume:  34.7 ml  PSA density:  0.15 Biopsy results:             Benign prostate tissue, PIN left mid gland  Complications after biopsy: none Patient without significant LUTS.    IPSS = 14 Patient with erectile dysfunction.     Portions of the above documentation were copied from a prior visit for review purposes only.  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   Dizziness 05/23/2020   Dyspnea on exertion 05/23/2020   Essential hypertension 05/23/2020   Hyperlipidemia    Shoulder injury, left, initial encounter 07/02/2017   Smoking 05/23/2020    Past Surgical History:  Past Surgical History:  Procedure Laterality Date    COLONOSCOPY     R wrist fx repaired     SHOULDER ARTHROSCOPY Left     Allergies:  No Known Allergies  Family History:  Family History  Problem Relation Age of Onset   Prostate cancer Father    Colon cancer Neg Hx    Colon polyps Neg Hx    Esophageal cancer Neg Hx    Stomach cancer Neg Hx    Rectal cancer Neg Hx     Social History:  Social History   Tobacco Use   Smoking status: Former    Packs/day: 0.25    Years: 46.00    Additional pack years: 0.00    Total pack years: 11.50    Types: Cigarettes    Quit date: 07/09/2021    Years since quitting: 1.4   Smokeless tobacco: Never  Vaping Use   Vaping Use: Never used  Substance Use Topics   Alcohol use: Yes    Comment: once day-beer   Drug use: Yes    Types: Marijuana    Comment: daily, last was 10/02/21 this week    ROS: Constitutional:  Negative for fever, chills, weight loss CV: Negative for chest pain, previous MI, hypertension Respiratory:  Negative for shortness of breath, wheezing, sleep apnea, frequent cough GI:  Negative for nausea, vomiting, bloody stool, GERD  Physical exam: BP 129/80   Pulse (!) 55   Ht 5\' 9"  (1.753 m)   Wt 149 lb (67.6 kg)   BMI  22.00 kg/m  GENERAL APPEARANCE:  Well appearing, well developed, well nourished, NAD HEENT:  Atraumatic, normocephalic, oropharynx clear NECK:  Supple without lymphadenopathy or thyromegaly ABDOMEN:  Soft, non-tender, no masses EXTREMITIES:  Moves all extremities well, without clubbing, cyanosis, or edema NEUROLOGIC:  Alert and oriented x 3, normal gait, CN II-XII grossly intact MENTAL STATUS:  appropriate BACK:  Non-tender to palpation, No CVAT SKIN:  Warm, dry, and intact   Results: U/A: 11-30 WBCs, >30 RBCs

## 2022-12-04 ENCOUNTER — Ambulatory Visit: Payer: PPO | Admitting: Physical Therapy

## 2022-12-04 ENCOUNTER — Encounter: Payer: Self-pay | Admitting: Physical Therapy

## 2022-12-04 DIAGNOSIS — M5459 Other low back pain: Secondary | ICD-10-CM

## 2022-12-04 DIAGNOSIS — M5416 Radiculopathy, lumbar region: Secondary | ICD-10-CM

## 2022-12-04 DIAGNOSIS — M62830 Muscle spasm of back: Secondary | ICD-10-CM

## 2022-12-04 DIAGNOSIS — M6281 Muscle weakness (generalized): Secondary | ICD-10-CM

## 2022-12-04 NOTE — Therapy (Signed)
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   Dizziness 05/23/2020   Dyspnea on exertion 05/23/2020   Essential hypertension 05/23/2020   Hyperlipidemia    Shoulder injury, left, initial encounter 07/02/2017   Smoking 05/23/2020   Past Surgical History:  Procedure Laterality Date   COLONOSCOPY     R wrist fx repaired     SHOULDER ARTHROSCOPY Left    Patient Active Problem List   Diagnosis Date Noted   Elevated PSA 10/31/2022   Family history of prostate cancer in father 10/31/2022   Dyslipidemia 07/16/2022   Facet arthropathy, lumbosacral 09/26/2021   Simple cyst of kidney 07/02/2021   Spinal stenosis, lumbar region, with neurogenic claudication 06/24/2021   Essential hypertension 05/23/2020   Dizziness 05/23/2020   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  NEXT MD  VISIT: TBD   SUBJECTIVE:                                                                                                                                                                                                         SUBJECTIVE STATEMENT: Pt denies pain and states the tingling is not as bad.  PAIN: Are you having pain? Yes: NPRS scale: 2/10 Pain location: B LE to calves Pain description: tingling  Aggravating factors: sitting for prolonged period, twisting motions Relieving factors: uncertain  PERTINENT HISTORY:  Lumbar spinal stenosis with neurogenic claudication, bradycardia, dizziness, DOE, HTN  PRECAUTIONS: None  WEIGHT BEARING RESTRICTIONS: No  FALLS:  Has patient fallen in last 6 months? No  LIVING ENVIRONMENT: Lives with: lives with their spouse Lives in: House/apartment Stairs: Yes: Internal: 9 steps; on left going up and External: 3 steps; on left going up Has following equipment at home:  walking stick  OCCUPATION: Part-time - carpentry, Web designer  PLOF: Independent and Leisure: mostly sedentary  PATIENT GOALS: "No pain."   OBJECTIVE: (objective measures completed at initial evaluation unless otherwise dated)  DIAGNOSTIC FINDINGS:  06/29/21 - Lumbar MRI:  IMPRESSION: 1. Moderate to severe multilevel spondylosis of the lumbar spine as described. 2. Moderate to severe central canal stenosis at L3-4 with crowding of nerve roots and bilateral subarticular stenosis. 3. Severe right and moderate left foraminal stenosis at L3-4. 4. Moderate subarticular and severe foraminal narrowing bilaterally at L4-5. 5. Severe left and moderate right foraminal stenosis at L5-S1. 6. Mild subarticular and foraminal narrowing bilaterally at L2-3. 7. Mild left foraminal narrowing at T12-L1.  06/24/21 - Lumbar x-ray:  IMPRESSION:  Multilevel degenerative change without acute abnormality.  PATIENT SURVEYS:  Modified Oswestry 18 / 50 = 36.0 %  FOTO Lumbar spine = 54,  predicted = 61  SCREENING FOR RED FLAGS: Bowel or bladder incontinence: Yes: urinary Spinal tumors: No Cauda equina syndrome: No Compression fracture: No Abdominal aneurysm: No  COGNITION:  Overall cognitive status: Within functional limits for tasks assessed    SENSATION: Constant numbness and tingling in B LE  MUSCLE LENGTH: Hamstrings: mild tight B ITB: WFL Piriformis: mild tight B Hip flexors: mild tight B Quads: mild tight B Heelcord: NT  POSTURE:  flexed trunk   PALPATION: Increased muscle tension in bilateral lumbar paraspinals with significant lumbar spine hypomobility.  LUMBAR ROM:  Tightness noted with most motions, but denies increased pain Active  eval  Flexion WFL  Extension 60% limited  Right lateral flexion WFL  Left lateral flexion WFL  Right rotation WFL  Left rotation WFL  (Blank rows = not tested)  LOWER EXTREMITY ROM:    B LE grossly WFL  LOWER EXTREMITY MMT:    MMT Right eval Left eval  Hip flexion 4+ 4  Hip extension 4- 4  Hip abduction 4- 4  Hip adduction 4+ 4+  Hip internal rotation 4 4  Hip external rotation 4+ 4  Knee flexion 4 4  Knee extension 4+ 4+  Ankle dorsiflexion 4 4  Ankle plantarflexion 4 4  Ankle inversion    Ankle eversion     (Blank rows = not tested)  LUMBAR SPECIAL TESTS:  Straight leg raise test: Negative, Slump test: Negative, and FABER test: Negative - Pt reports slump test positions seems to help alleviate pain/radicular symptoms   TODAY'S TREATMENT:   12/04/22 THERAPEUTIC EXERCISE: to improve flexibility, strength and mobility.  Demonstration, verbal and tactile cues throughout for technique.  TM - 0.9 mph x 5 min Hooklying sciatic nerve glide 2 x 10 bil DKTC with heels on orange Pball 2 x 10, 2nd set with slight overpressure on knees to chest LTR with LEs on orange Pball x 10 Prone alt hip extension 10 x 3" Quadruped rocking from child's pose to cobra pose x 10 Staggered stance TrA + GTB row x  10 Opp staggered stance TrA + GTB scap retraction/shoulder extension x 10  MANUAL THERAPY: To promote normalized muscle tension, improved flexibility, and reduced pain.  Single leg longitudinal  distraction/traction x 30 sec (bil) - pt noting reduction in B LE tingling Hooklying manual lumbar traction 2 x 30 sec - pt noting reduction in B LE tingling   12/01/22 THERAPEUTIC EXERCISE: to improve flexibility, strength and mobility.  Demonstration, verbal and tactile cues throughout for technique.  NuStep - L5 x 6 min Standing alt hip ABD x 10 w/o added resistance, x 10 with looped GTB just below knees Standing alt hip ABD x 10 w/o added resistance, x 10 with looped GTB just below knees Standing TrA + GTB rows 10 x 3" Standing TrA + GTB shoulder extension/scap retraction 10 x 3" Standing B GTB pallof press x 10 each side Standing B GTB multifidi walk-out with hands at chest x 10 Standing lunge position hip flexor stretch 2 x 30" bil Standing lumbar extension (buttocks resting against counter) 10 x 3-5"   11/25/22 THERAPEUTIC EXERCISE: to improve flexibility, strength and mobility.  Verbal and tactile cues throughout for technique. UBE L2.0 3 min fwd/ 3 min back Supine B bent knee fallouts green TB 10x5" Supine single bent knee fallouts green TB 10x5" B Prone on elbows 3x30 sec Prone leg curls 2x10 B Prone press up 10x3" Quadruped cat/cow 10x3" Seated flexion stretch with green pball 2x30 sec Standing lumbar extension 10x3" Standing hip extension 5x  Standing hip abduction x 10 bil   PATIENT EDUCATION:  Education details: HEP review and HEP modification - deferred LTR & added prone hip extension   Person educated: Patient Education method: Explanation, Demonstration, Tactile cues, Verbal cues, and Handouts Education comprehension: verbalized understanding, returned demonstration, verbal cues required, tactile cues required, and needs further education  HOME EXERCISE PROGRAM: Access  Code: [email or web address] URL: [email or web address] Date: 12/04/2022 Prepared by: Glenetta Hew  Exercises - Seated Slump Nerve Glide  - 2-3 x daily - 7 x weekly - 2 sets - 10 reps - 3 sec hold - Static Prone on Elbows  - 2-3 x daily - 7 x weekly - 2 sets - 3 reps - 30-60 sec hold - Prone Press Up  - 2-3 x daily - 7 x weekly - 2 sets - 10 reps - 2 sec hold - Standing Lumbar Extension  - 2-3 x daily - 7 x weekly - 2 sets - 10 reps - 3 sec hold - Hooklying Single Leg Bent Knee Fallouts with Resistance  - 1 x daily - 7 x weekly - 2 sets - 10 reps - 3 sec hold - Supine Bridge with Resistance Band  - 1 x daily - 7 x weekly - 2 sets - 10 reps - 5 sec hold - Prone Hip Extension with Pillow Under Abdomen  - 1 x daily - 7 x weekly - 2 sets - 10 reps - 3 sec hold - Standing Hip Extension with Counter Support  - 1 x daily - 7 x weekly - 3 sets - 5 reps - Standing Hip Abduction with Counter Support  - 1 x daily - 7 x weekly - 3 sets - 10 reps - Standing Shoulder Row with Anchored Resistance  - 1 x daily - 7 x weekly - 2 sets - 10 reps - 5 sec hold - Scapular Retraction with Resistance Advanced  - 1 x daily - 7 x weekly - 2 sets - 10 reps - 5 sec hold  Patient Education - Trigger Point Dry Needling   CLINICAL IMPRESSION: Bevin reports his pain has been better since he injection and notes the LE tingling seems to be  lessening.  Further improvement noted in reduction in LE tingling with manual traction followed by sciatic nerve glides with improvement remaining throughout the rest of the exercises today.  Continued to progress lumbopelvic flexibility and strengthening with good tolerance other than UE weakness noted during forward weight shift in quadruped rocking.  Added upper body focused lumbopelvic strengthening to HEP to address UE/UB weakness.  Arney is pleased with his progress with physical therapy and is still hopeful to make his next visit his last PT visit, therefore we will plan for goal  assessment and final review/update of HEP as indicated.  OBJECTIVE IMPAIRMENTS: Abnormal gait, decreased activity tolerance, decreased endurance, decreased knowledge of condition, decreased mobility, difficulty walking, decreased ROM, decreased strength, increased fascial restrictions, impaired perceived functional ability, increased muscle spasms, impaired flexibility, improper body mechanics, postural dysfunction, and pain.   ACTIVITY LIMITATIONS: carrying, lifting, bending, sitting, standing, squatting, sleeping, transfers, bed mobility, continence, locomotion level, and caring for others  PARTICIPATION LIMITATIONS: meal prep, cleaning, laundry, driving, shopping, community activity, occupation, and yard work  PERSONAL FACTORS: Fitness, Past/current experiences, Time since onset of injury/illness/exacerbation, and 3+ comorbidities: Lumbar spinal stenosis with neurogenic claudication, bradycardia, dizziness, DOE, HTN  are also affecting patient's functional outcome.   REHAB POTENTIAL: Good  CLINICAL DECISION MAKING: Stable/uncomplicated  EVALUATION COMPLEXITY: Low   GOALS: Goals reviewed with patient? Yes  SHORT TERM GOALS: Target date: 11/27/2022  Patient will be independent with initial HEP to improve outcomes and carryover.  Baseline:  Goal status: MET  11/19/22  2.  Patient will report centralization of radicular symptoms.  Baseline:  B LE numbness and tingling to feet Goal status: MET  12/01/22 - Pt reporting tingling less intense and no longer goes past his ankles but still into calves at times  LONG TERM GOALS: Target date: 12/18/2022   Patient will be independent with ongoing/advanced HEP for self-management at home.  Baseline:  Goal status: IN PROGRESS  2.  Patient will report 50-75% improvement in low back pain & B LE radiculopathy to improve QOL.  Baseline:  Goal status: MET  12/04/22  3.  Patient to demonstrate ability to achieve and maintain good spinal  alignment/posturing and body mechanics needed for daily activities. Baseline:  Goal status: IN PROGRESS  4.  Patient will demonstrate full pain free lumbar ROM to perform ADLs.   Baseline: limited lumbar extension ROM Goal status: IN PROGRESS  5.  Patient will demonstrate improved B LE strength to >/= 4+/5 for improved stability and ease of mobility . Baseline: refer to above MMT Goal status: IN PROGRESS  6.  Patient will report >/= 61 on lumbar FOTO to demonstrate improved functional ability.  Baseline: 54 Goal status: IN PROGRESS   7. Patient will report </= 24% on Modified Oswestry to demonstrate improved functional ability with decreased pain interference. Baseline: 18 / 50 = 36.0 % Goal status: IN PROGRESS  8.  Patient to report ability to perform ADLs, household, and work-related tasks without limitation due to LBP or LE radiculopathy, LOM or weakness. Baseline: currently out of work due to pain Goal status: IN PROGRESS   PLAN:  PT FREQUENCY: 2x/week  PT DURATION: 6 weeks  PLANNED INTERVENTIONS: Therapeutic exercises, Therapeutic activity, Neuromuscular re-education, Balance training, Gait training, Patient/Family education, Self Care, Joint mobilization, Aquatic Therapy, Dry Needling, Electrical stimulation, Spinal manipulation, Spinal mobilization, Cryotherapy, Moist heat, Taping, Traction, Ultrasound, Manual therapy, and Re-evaluation  PLAN FOR NEXT SESSION: review & update HEP; goal assessment; anticipate transition to HEP +/- 30-day  hold  Marry Guan, PT 12/04/2022, 9:57 AM

## 2022-12-08 ENCOUNTER — Ambulatory Visit: Payer: PPO | Attending: Family Medicine | Admitting: Physical Therapy

## 2022-12-08 ENCOUNTER — Encounter: Payer: Self-pay | Admitting: Physical Therapy

## 2022-12-08 DIAGNOSIS — M6281 Muscle weakness (generalized): Secondary | ICD-10-CM | POA: Insufficient documentation

## 2022-12-08 DIAGNOSIS — M5416 Radiculopathy, lumbar region: Secondary | ICD-10-CM | POA: Diagnosis not present

## 2022-12-08 DIAGNOSIS — M62830 Muscle spasm of back: Secondary | ICD-10-CM | POA: Diagnosis not present

## 2022-12-08 DIAGNOSIS — M5459 Other low back pain: Secondary | ICD-10-CM | POA: Diagnosis not present

## 2022-12-08 NOTE — Therapy (Signed)
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   Dizziness 05/23/2020   Dyspnea on exertion 05/23/2020   Essential hypertension 05/23/2020   Hyperlipidemia    Shoulder injury, left, initial encounter 07/02/2017   Smoking 05/23/2020   Past Surgical History:  Procedure Laterality Date   COLONOSCOPY     R wrist fx repaired     SHOULDER ARTHROSCOPY Left    Patient Active Problem List   Diagnosis Date Noted   Elevated PSA 10/31/2022   Family history of prostate cancer in father 10/31/2022   Dyslipidemia 07/16/2022   Facet arthropathy, lumbosacral 09/26/2021   Simple cyst of kidney 07/02/2021   Spinal stenosis, lumbar region, with neurogenic claudication 06/24/2021   Essential hypertension 05/23/2020   Dizziness 05/23/2020   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  NEXT MD VISIT: TBD   SUBJECTIVE:                                                                                                                                                                                                         SUBJECTIVE STATEMENT: Pt denies pain. He still has the tingling some days upon rising but has already subsided today.  PAIN: Are you having pain? No  PERTINENT HISTORY:  Lumbar spinal stenosis with neurogenic claudication, bradycardia, dizziness, DOE, HTN  PRECAUTIONS: None  WEIGHT BEARING RESTRICTIONS: No  FALLS:  Has patient fallen in last 6 months? No  LIVING ENVIRONMENT: Lives with: lives with their spouse Lives in: House/apartment Stairs: Yes: Internal: 9 steps; on left going up and External: 3 steps; on left going up Has following equipment at home:  walking stick  OCCUPATION: Part-time - carpentry, Web designer  PLOF: Independent and Leisure: mostly sedentary  PATIENT GOALS: "No pain."   OBJECTIVE: (objective measures completed at initial evaluation unless otherwise dated)  DIAGNOSTIC FINDINGS:  06/29/21 - Lumbar MRI:  IMPRESSION: 1. Moderate to severe multilevel spondylosis of the lumbar spine as described. 2. Moderate to severe central canal stenosis at L3-4 with crowding of nerve roots and bilateral subarticular stenosis. 3. Severe right and moderate left foraminal stenosis at L3-4. 4. Moderate subarticular and severe foraminal narrowing bilaterally at L4-5. 5. Severe left and moderate right foraminal stenosis at L5-S1. 6. Mild subarticular and foraminal narrowing bilaterally at L2-3. 7. Mild left foraminal narrowing at T12-L1.  06/24/21 - Lumbar x-ray:  IMPRESSION:  Multilevel degenerative change without acute abnormality.  PATIENT SURVEYS:  Modified Oswestry 18 / 50 = 36.0 %  FOTO Lumbar spine = 54, predicted = 61  12/08/22: Modified Oswestry = 1 / 50 = 2.0 % Lumbar FOTO = 94  SCREENING FOR RED FLAGS: Bowel or  bladder incontinence: Yes: urinary Spinal tumors: No Cauda equina syndrome: No Compression fracture: No Abdominal aneurysm: No  COGNITION:  Overall cognitive status: Within functional limits for tasks assessed    SENSATION: Constant numbness and tingling in B LE  MUSCLE LENGTH: Hamstrings: mild tight B ITB: WFL Piriformis: mild tight B Hip flexors: mild tight B Quads: mild tight B Heelcord: NT  POSTURE:  flexed trunk   PALPATION: Increased muscle tension in bilateral lumbar paraspinals with significant lumbar spine hypomobility.  LUMBAR ROM:  Tightness noted with most motions, but denies increased pain Active  eval 12/08/22  Flexion [facility] WFL  Extension 60% limited 25% limited  Right lateral flexion [facility] WFL  Left lateral flexion [facility] WFL  Right rotation [facility] WFL  Left rotation WFL WFL  (Blank rows = not tested)  LOWER EXTREMITY ROM:    B LE grossly WFL  LOWER EXTREMITY MMT:    MMT Right eval Left eval R 12/08/22 L 12/08/22  Hip flexion 4+ 4 5 5   Hip extension 4- 4 4+ 5  Hip abduction 4- 4 4+ 5  Hip adduction 4+ 4+ 4+ 5  Hip internal rotation 4 4 5 5   Hip external rotation 4+ 4 5 4+  Knee flexion 4 4 5 5   Knee extension 4+ 4+ 5 5  Ankle dorsiflexion 4 4 5 5   Ankle plantarflexion 4 4 5 5   Ankle inversion      Ankle eversion       (Blank rows = not tested)  LUMBAR SPECIAL TESTS:  Straight leg raise test: Negative, Slump test: Negative, and FABER test: Negative - Pt reports slump test positions seems to help alleviate pain/radicular symptoms   TODAY'S TREATMENT:   12/08/22 THERAPEUTIC EXERCISE: to improve flexibility, strength and mobility.  Demonstration, verbal and tactile cues throughout for technique.  Rec Bike - L3 x 6 min Standing alt hip extension x 10 with looped GTB at ankles Standing alt hip ABD x 10 with looped GTB at ankles Staggered stance TrA + blue TB row x 10 Opp staggered stance TrA + blue TB scap retraction/shoulder extension x  10  THERAPEUTIC ACTIVITIES: Lumbar ROM LE MMT  Lumbar FOTO = 94 Modified Oswestry = 1 / 50 = 2.0 %   12/04/22 THERAPEUTIC EXERCISE: to improve flexibility, strength and mobility.  Demonstration, verbal and tactile cues throughout for technique.  TM - 0.9 mph x 5 min Hooklying sciatic nerve glide 2 x 10 bil DKTC with heels on orange Pball 2 x 10, 2nd set with slight overpressure on knees to chest LTR with LEs on orange Pball x 10 Prone alt hip extension 10 x 3" Quadruped rocking from child's pose to cobra pose x 10 Staggered stance TrA + GTB row x 10 Opp staggered stance TrA + GTB scap retraction/shoulder extension x 10  MANUAL THERAPY: To promote normalized muscle tension, improved flexibility, and reduced pain.  Single leg longitudinal distraction/traction x 30 sec (bil) - pt noting reduction in B LE tingling Hooklying manual lumbar traction 2 x 30 sec - pt noting reduction in B LE tingling   12/01/22 THERAPEUTIC EXERCISE: to improve flexibility, strength and mobility.  Demonstration, verbal and tactile cues throughout for technique.  NuStep - L5 x 6 min Standing alt hip ABD x 10 w/o added resistance, x 10 with looped GTB just below knees Standing alt hip extension x 10 w/o added resistance, x 10 with looped GTB just below knees Standing TrA + GTB rows 10 x 3" Standing TrA + GTB shoulder extension/scap retraction 10 x 3" Standing B GTB pallof press x 10 each side Standing B GTB multifidi walk-out with hands at chest x 10 Standing lunge position hip flexor stretch 2 x 30" bil Standing lumbar extension (buttocks resting against counter) 10 x 3-5"   PATIENT EDUCATION:  Education details: HEP review and HEP modification - deferred LTR & added prone hip extension   Person educated: Patient Education method: Explanation, Demonstration, Tactile cues, Verbal cues, and Handouts Education comprehension: verbalized understanding, returned demonstration, verbal cues required, tactile cues  required, and needs further education  HOME EXERCISE PROGRAM: Access Code: [email or web address] URL: [email or web address] Date: 12/08/2022 Prepared by: Glenetta Hew  Exercises - Seated Slump Nerve Glide  - 2-3 x daily - 7 x weekly - 2 sets - 10 reps - 3 sec hold - Static Prone on Elbows  - 2-3 x daily - 7 x weekly - 2 sets - 3 reps - 30-60 sec hold - Prone Press Up  - 2-3 x daily - 7 x weekly - 2 sets - 10 reps - 2 sec hold - Standing Lumbar Extension  - 2-3 x daily - 7 x weekly - 2 sets - 10 reps - 3 sec hold - Hooklying Single Leg Bent Knee Fallouts with Resistance  - 1 x daily - 7 x weekly - 2 sets - 10 reps - 3 sec hold - Supine Bridge with Resistance Band  - 1 x daily - 7 x weekly - 2 sets - 10 reps - 5 sec hold - Prone Hip Extension with Pillow Under Abdomen  - 1 x daily - 7 x weekly - 2 sets - 10 reps - 3 sec hold - Standing Shoulder Row with Anchored Resistance  - 1 x daily - 7 x weekly - 2 sets - 10 reps - 5 sec hold - Scapular Retraction with Resistance Advanced  - 1 x daily - 7 x weekly - 2 sets - 10 reps - 5 sec hold - Standing Hip Extension with Resistance at Ankles and Counter Support  - 1 x daily - 3 x weekly - 2 sets - 10 reps - 3 sec hold -  Standing Hip Abduction with Resistance at Ankles and Unilateral Counter Support  - 1 x daily - 3 x weekly - 2 sets - 10 reps - 3 sec hold  Patient Education - Trigger Point Dry Needling   CLINICAL IMPRESSION: Creek reports his pain is "100% better" and he no longer feels limited by pain with any of his normal daily activities. He full functional lumbar ROM w/o pain and his overall LE strength is now 4+ to 5/5. We reviewed his HEP, clarifying movement patterns and advancing resistance as appropriate, with Altan able to provide good return demonstration. All PT goals now met and Keland feels ready to transition to his HEP, therefore will proceed with discharge from physical therapy for this episode.  OBJECTIVE IMPAIRMENTS: Abnormal  gait, decreased activity tolerance, decreased endurance, decreased knowledge of condition, decreased mobility, difficulty walking, decreased ROM, decreased strength, increased fascial restrictions, impaired perceived functional ability, increased muscle spasms, impaired flexibility, improper body mechanics, postural dysfunction, and pain.   ACTIVITY LIMITATIONS: carrying, lifting, bending, sitting, standing, squatting, sleeping, transfers, bed mobility, continence, locomotion level, and caring for others  PARTICIPATION LIMITATIONS: meal prep, cleaning, laundry, driving, shopping, community activity, occupation, and yard work  PERSONAL FACTORS: Fitness, Past/current experiences, Time since onset of injury/illness/exacerbation, and 3+ comorbidities: Lumbar spinal stenosis with neurogenic claudication, bradycardia, dizziness, DOE, HTN  are also affecting patient's functional outcome.   REHAB POTENTIAL: Good  CLINICAL DECISION MAKING: Stable/uncomplicated  EVALUATION COMPLEXITY: Low   GOALS: Goals reviewed with patient? Yes  SHORT TERM GOALS: Target date: 11/27/2022  Patient will be independent with initial HEP to improve outcomes and carryover.  Baseline:  Goal status: MET  11/19/22  2.  Patient will report centralization of radicular symptoms.  Baseline:  B LE numbness and tingling to feet Goal status: MET  12/01/22 - Pt reporting tingling less intense and no longer goes past his ankles but still into calves at times  LONG TERM GOALS: Target date: 12/18/2022   Patient will be independent with ongoing/advanced HEP for self-management at home.  Baseline:  Goal status: MET  12/08/22  2.  Patient will report 50-75% improvement in low back pain & B LE radiculopathy to improve QOL.  Baseline:  Goal status: MET  12/04/22 & 12/08/22 - "100% better"  3.  Patient to demonstrate ability to achieve and maintain good spinal alignment/posturing and body mechanics needed for daily activities. Baseline:   Goal status: MET  12/08/22  4.  Patient will demonstrate full pain free lumbar ROM to perform ADLs.   Baseline: limited lumbar extension ROM Goal status: MET  12/08/22  5.  Patient will demonstrate improved B LE strength to >/= 4+/5 for improved stability and ease of mobility . Baseline: refer to above MMT Goal status: MET  12/08/22  6.  Patient will report >/= 61 on lumbar FOTO to demonstrate improved functional ability.  Baseline: 54 Goal status: MET  12/08/22 - Lumbar FOTO = 94  7. Patient will report </= 24% on Modified Oswestry to demonstrate improved functional ability with decreased pain interference. Baseline: 18 / 50 = 36.0 % Goal status: MET  12/08/22 - 1 / 50 = 2.0 %  8.  Patient to report ability to perform ADLs, household, and work-related tasks without limitation due to LBP or LE radiculopathy, LOM or weakness. Baseline: currently out of work due to pain Goal status: MET  12/08/22   PLAN:  PT FREQUENCY: 2x/week  PT DURATION: 6 weeks  PLANNED INTERVENTIONS: Therapeutic exercises, Therapeutic activity, Neuromuscular  re-education, Balance training, Gait training, Patient/Family education, Self Care, Joint mobilization, Aquatic Therapy, Dry Needling, Electrical stimulation, Spinal manipulation, Spinal mobilization, Cryotherapy, Moist heat, Taping, Traction, Ultrasound, Manual therapy, and Re-evaluation  PLAN FOR NEXT SESSION:  transition to HEP + D/C from PT  PHYSICAL THERAPY DISCHARGE SUMMARY  Visits from Start of Care: 7  Current functional level related to goals / functional outcomes: Refer to above clinical impression and goal assessment.   Remaining deficits: As above.   Education / Equipment: HEP   Patient agrees to discharge. Patient goals were met. Patient is being discharged due to meeting the stated rehab goals.  Marry Guan, PT 12/08/2022, 12:03 PM

## 2023-01-21 ENCOUNTER — Other Ambulatory Visit: Payer: Self-pay | Admitting: Pharmacist

## 2023-01-21 NOTE — Progress Notes (Signed)
Pharmacy Quality Measure Review  This patient is appearing on a report for being at risk of failing the adherence measure for cholesterol (statin) and hypertension (ACEi/ARB) medications this calendar year.   Medication:  -Atorvastatin 40 mg tablets  -Losartan-hydrochlorothiazide 100-25 mg tablets   Last fill date:  Atorvastatin: 10/28/22 for 90 day supply Losartan-hydrochlorothiazide: 10/28/22 for a 90-day supply.   Called patient's pharmacy and ordered refills for both. Also sent new rxns to have on file for next fill date.  Butch Penny, PharmD, P[place]y [place], CPP Clinical Pharmacist [facility] [phone]

## 2023-02-20 ENCOUNTER — Ambulatory Visit: Payer: Self-pay

## 2023-02-20 NOTE — Telephone Encounter (Signed)
Chief Complaint: Numbness and Weakness Symptoms: Right arm and leg numbness and weakness reported Frequency: constant for a few months  Pertinent Negatives: Patient denies change in speech, facial changes, blurred vision, chest pain or headache Disposition: [] ED /[x] Urgent Care (no appt availability in office) / [] Appointment(In office/virtual)/ []  [place] [facility]/ [] Home Care/ [] Refused Recommended Disposition /[]  Mobile Bus/ []  Follow-up with PCP Additional Notes: Patient states he has numbness in the right arm and leg that has been ongoing for a few months at this time. Patient denies all other symptoms. Patient states he is unsure what is causing the symptoms but is concerned it maybe a circulation problem. Care advice was given and patient was advised to seek care at urgent care. Patient stated he would go tomorrow. Advised patient if symptoms get worse to go to the ED. Patient verbalized understanding. Patient declined scheduling an appointment at urgent care.  Reason for Disposition  [1] Numbness (i.e., loss of sensation) of the face, arm / hand, or leg / foot on one side of the body AND [2] gradual onset (e.g., days to weeks) AND [3] present now  Answer Assessment - Initial Assessment Questions 1. SYMPTOM: "What is the main symptom you are concerned about?" (e.g., weakness, numbness)     Numbness and weakness in right arm and leg 2. ONSET: "When did this start?" (minutes, hours, days; while sleeping)     A few months  3. LAST NORMAL: "When was the last time you (the patient) were normal (no symptoms)?"     A few months ago  4. PATTERN "Does this come and go, or has it been constant since it started?"  "Is it present now?"     Constant  5. CARDIAC SYMPTOMS: "Have you had any of the following symptoms: chest pain, difficulty breathing, palpitations?"     No  6. NEUROLOGIC SYMPTOMS: "Have you had any of the following symptoms: headache, dizziness, vision loss, double  vision, changes in speech, unsteady on your feet?"     No  7. OTHER SYMPTOMS: "Do you have any other symptoms?"     No  Protocols used: Neurologic Deficit-A-AH

## 2023-02-23 NOTE — Telephone Encounter (Addendum)
Spoke with patient , patient given an earlier appointment.

## 2023-03-12 ENCOUNTER — Ambulatory Visit: Payer: PPO | Attending: Physician Assistant | Admitting: Physician Assistant

## 2023-03-12 ENCOUNTER — Encounter: Payer: Self-pay | Admitting: Physician Assistant

## 2023-03-12 VITALS — BP 122/78 | HR 54 | Wt 145.6 lb

## 2023-03-12 DIAGNOSIS — M5442 Lumbago with sciatica, left side: Secondary | ICD-10-CM

## 2023-03-12 DIAGNOSIS — I10 Essential (primary) hypertension: Secondary | ICD-10-CM | POA: Diagnosis not present

## 2023-03-12 DIAGNOSIS — F1290 Cannabis use, unspecified, uncomplicated: Secondary | ICD-10-CM | POA: Diagnosis not present

## 2023-03-12 DIAGNOSIS — G8929 Other chronic pain: Secondary | ICD-10-CM

## 2023-03-12 DIAGNOSIS — M5441 Lumbago with sciatica, right side: Secondary | ICD-10-CM | POA: Diagnosis not present

## 2023-03-12 DIAGNOSIS — E7800 Pure hypercholesterolemia, unspecified: Secondary | ICD-10-CM | POA: Diagnosis not present

## 2023-03-12 MED ORDER — AMLODIPINE BESYLATE 10 MG PO TABS
10.0000 mg | ORAL_TABLET | Freq: Every day | ORAL | 2 refills | Status: DC
Start: 2023-03-12 — End: 2023-09-14

## 2023-03-12 MED ORDER — ATORVASTATIN CALCIUM 40 MG PO TABS
40.0000 mg | ORAL_TABLET | Freq: Every day | ORAL | 1 refills | Status: DC
Start: 2023-03-12 — End: 2023-09-14

## 2023-03-12 MED ORDER — GABAPENTIN 300 MG PO CAPS
300.0000 mg | ORAL_CAPSULE | Freq: Three times a day (TID) | ORAL | 1 refills | Status: DC
Start: 2023-03-12 — End: 2023-09-14

## 2023-03-12 MED ORDER — LOSARTAN POTASSIUM-HCTZ 100-25 MG PO TABS
1.0000 | ORAL_TABLET | Freq: Every day | ORAL | 1 refills | Status: DC
Start: 2023-03-12 — End: 2023-09-14

## 2023-03-12 NOTE — Progress Notes (Signed)
Patient ID: Isaac Chan, male   DOB: 22-Jan-1956, 67 y.o.   MRN: [id number]   Isaac Chan, is a 67 y.o. male  [id number]  HYQ:[id number]  DOB - 1955/11/03  Chief Complaint  Patient presents with   Numbness    Legs and arms        Subjective:   Isaac Chan is a 67 y.o. male here today for tingling in B feet and arms for 3 to 6 months.  He has not been taking gabapentin regularly bc he did not know that it helped with paresthesias. He denies CP/SOB/dizziness/weakness.  He does not have any other complaints today.  He has had previous workup for paresthesias including B12 and thyroid that was normal.    No problems updated.  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 Authorizing Provider  amLODipine (NORVASC) 10 MG tablet Take 1 tablet (10 mg total) by mouth daily. TAKE 1 TABLET(10 MG) BY MOUTH DAILY 03/12/23   Georgian Co M, PA-C  atorvastatin (LIPITOR) 40 MG tablet Take 1 tablet (40 mg total) by mouth daily. 03/12/23   Anders Simmonds, PA-C  gabapentin (NEURONTIN) 300 MG capsule Take 1 capsule (300 mg total) by mouth 3 (three) times daily. 03/12/23   Anders Simmonds, PA-C  losartan-hydrochlorothiazide (HYZAAR) 100-25 MG tablet Take 1 tablet by mouth daily. 03/12/23   Anders Simmonds, PA-C  spironolactone (ALDACTONE) 25 MG tablet Take 1 tablet (25 mg total) by mouth daily. 07/16/22   Georgeanna Lea, MD  tiZANidine (ZANAFLEX) 4 MG tablet Take 1 tablet (4 mg total) by mouth every 6 (six) hours as needed for up to 15 doses for muscle spasms. 09/09/21   Cheryll Cockayne, MD    ROS: Neg HEENT Neg resp Neg  cardiac Neg GI Neg GU Neg MS Neg psych Neg neuro  Objective:   Vitals:   03/12/23 1513 03/12/23 1527  BP:  122/78  Pulse: (!) 37 (!) 54  SpO2: 94% 98%  Weight: 145 lb 9.6 oz (66 kg)    Exam General appearance : Awake, alert, not in any distress. Speech Clear. Not toxic looking HEENT: Atraumatic and Normocephalic Neck: Supple, no JVD. No cervical lymphadenopathy.  Chest: Good air entry bilaterally, CTAB.  No rales/rhonchi/wheezing CVS: S1 S2 regular, no murmurs.  Extremities: B/L Lower Ext shows no edema, both legs are warm to touch Neurology: Awake alert, and oriented X 3, CN II-XII intact, Non focal Skin: No Rash  Data Review No results found for: "HGBA1C"  Assessment & Plan   1. Chronic bilateral low back pain with bilateral sciatica With paresthesias in feet and arms - gabapentin (NEURONTIN) 300 MG capsule; Take 1 capsule (300 mg total) by mouth 3 (three) times daily.  Dispense: 270 capsule; Refill: 1  2. Pure hypercholesterolemia - atorvastatin (LIPITOR) 40 MG tablet; Take 1 tablet (40 mg total) by mouth daily.  Dispense: 90 tablet; Refill: 1  3. Essential hypertension - amLODipine (NORVASC) 10 MG tablet; Take 1 tablet (10 mg total) by mouth daily. TAKE 1 TABLET(10 MG) BY MOUTH DAILY  Dispense: 90 tablet; Refill: 2 - losartan-hydrochlorothiazide (HYZAAR) 100-25 MG tablet; Take 1 tablet  by mouth daily.  Dispense: 90 tablet; Refill: 1  4. Marijuana use-   Return in about 6 months (around 09/10/2023) for PCP for chronic conditions.  The patient was given clear instructions to go to ER or return to medical center if symptoms don't improve, worsen or new problems develop. The patient verbalized understanding. The patient was told to call to get lab results if they haven't heard anything in the next week.      Georgian Co, PA-C [facility] [place], [place] [phone]   03/12/2023, 3:31 PM

## 2023-03-12 NOTE — Progress Notes (Signed)
94/62

## 2023-04-17 ENCOUNTER — Other Ambulatory Visit: Payer: Self-pay | Admitting: Cardiology

## 2023-04-22 ENCOUNTER — Ambulatory Visit: Payer: PPO | Admitting: Family Medicine

## 2023-04-23 IMAGING — XA Imaging study
2 series · 2 of 2 positions shown · non-contrast
Comparison: none

CLINICAL DATA: Spinal stenosis. Lumbago. Right worse than left
pain.

[Series 1: ortho standard · 1 of 1 slices shown (1 of 2)]
[im 1/1]
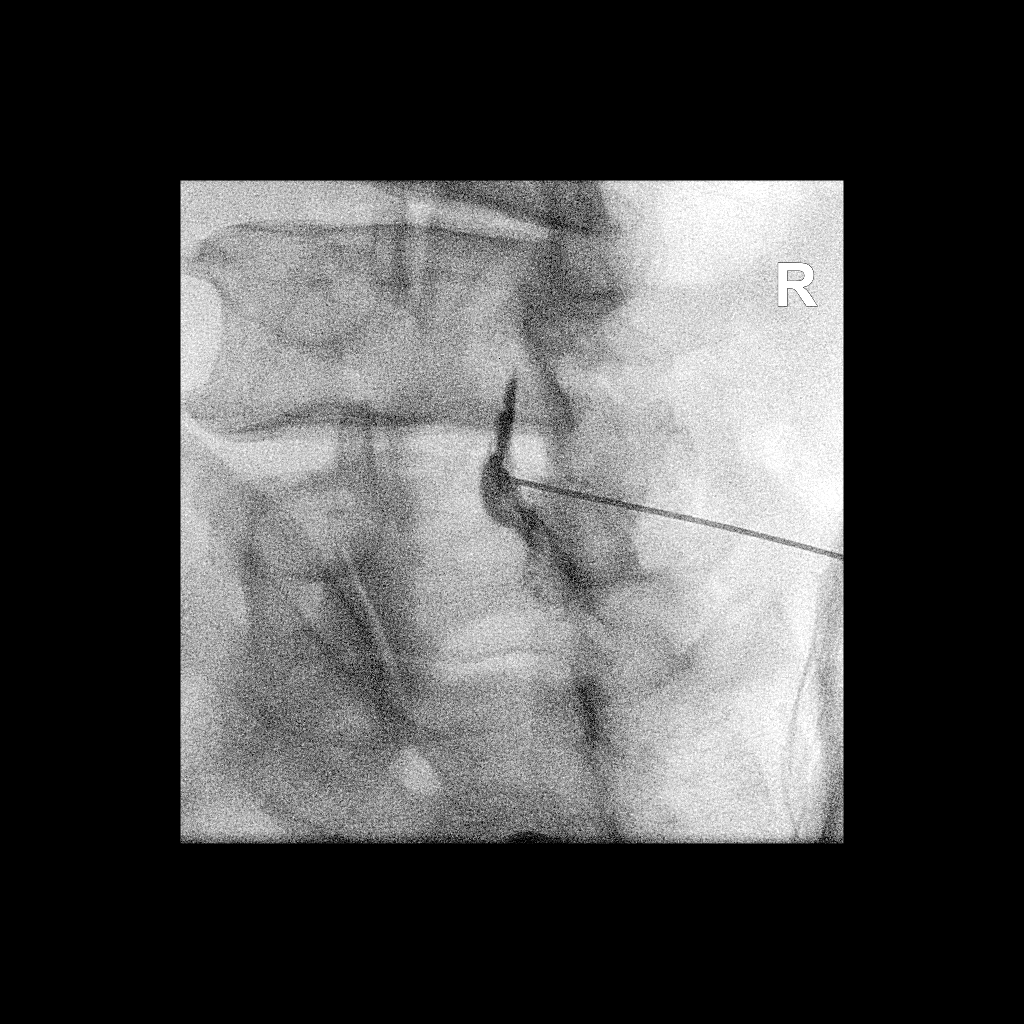

[Series 2: ortho standard · 1 of 1 slices shown (2 of 2)]
[im 1/1]
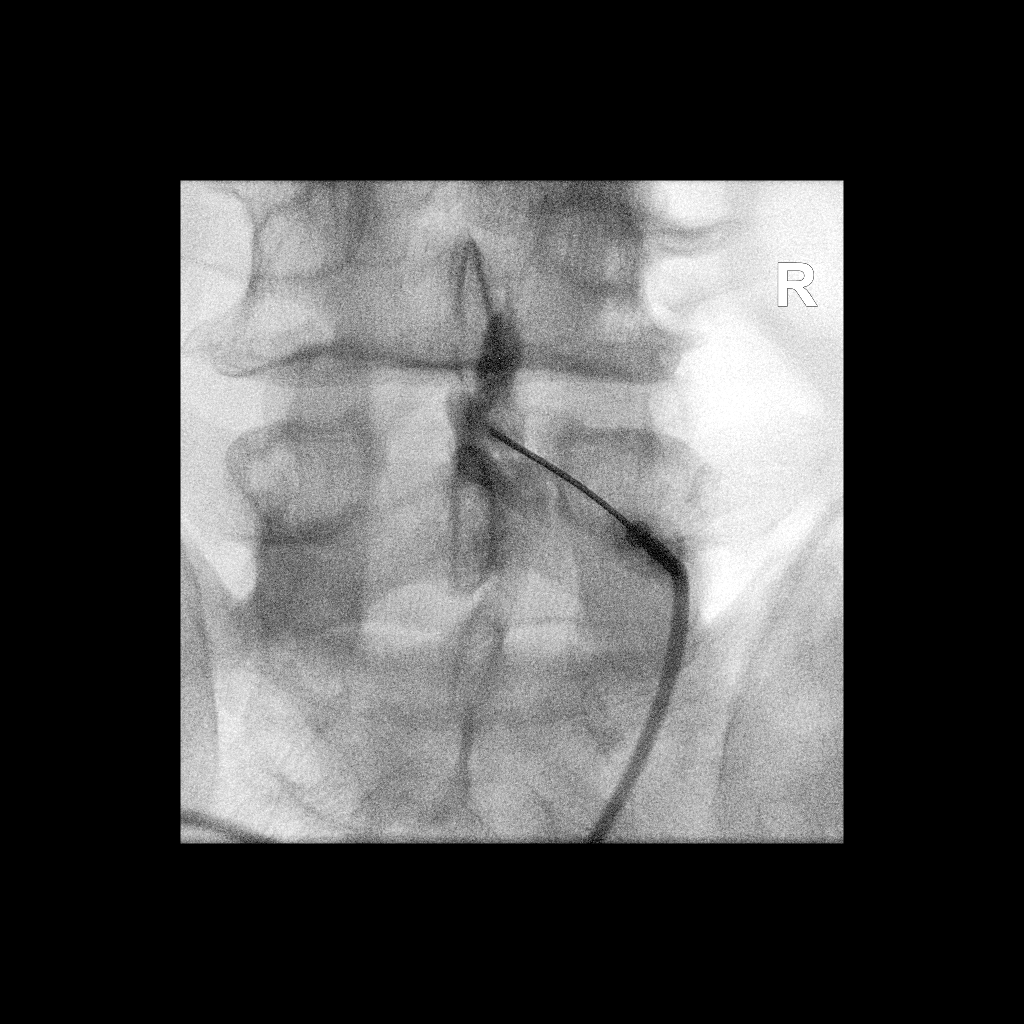

[2 of 2 positions shown; findings below may reference images not displayed]

FLUOROSCOPY TIME:  0 minutes 27 seconds. 21.03 micro gray meter
squared

PROCEDURE:
The procedure, risks, benefits, and alternatives were explained to
the patient. Questions regarding the procedure were encouraged and
answered. The patient understands and consents to the procedure.

LUMBAR EPIDURAL INJECTION:

An interlaminar approach was performed on the right at L4-5. The
overlying skin was cleansed and anesthetized. A 20 gauge epidural
needle was advanced using loss-of-resistance technique.

DIAGNOSTIC EPIDURAL INJECTION:

Injection of Isovue-M 200 shows a good epidural pattern with spread
above and below the level of needle placement, primarily on the
right, but to both sides no vascular opacification is seen.

THERAPEUTIC EPIDURAL INJECTION:

Eighty mg of Depo-Medrol mixed with 2.5 cc 1% lidocaine were
instilled. The procedure was well-tolerated, and the patient was
discharged thirty minutes following the injection in good condition.

COMPLICATIONS:
None
IMPRESSION: Technically successful epidural injection on the right at L4-5 # 1.

## 2023-05-20 ENCOUNTER — Encounter: Payer: Self-pay | Admitting: Urology

## 2023-05-20 ENCOUNTER — Ambulatory Visit: Payer: PPO | Admitting: Urology

## 2023-05-20 VITALS — BP 126/77 | HR 50 | Ht 69.0 in | Wt 144.0 lb

## 2023-05-20 DIAGNOSIS — N401 Enlarged prostate with lower urinary tract symptoms: Secondary | ICD-10-CM

## 2023-05-20 DIAGNOSIS — Z8042 Family history of malignant neoplasm of prostate: Secondary | ICD-10-CM

## 2023-05-20 DIAGNOSIS — R972 Elevated prostate specific antigen [PSA]: Secondary | ICD-10-CM | POA: Diagnosis not present

## 2023-05-20 DIAGNOSIS — N138 Other obstructive and reflux uropathy: Secondary | ICD-10-CM

## 2023-05-20 LAB — URINALYSIS, ROUTINE W REFLEX MICROSCOPIC
Bilirubin, UA: NEGATIVE
Glucose, UA: NEGATIVE
Ketones, UA: NEGATIVE
Nitrite, UA: NEGATIVE
Protein,UA: NEGATIVE
RBC, UA: NEGATIVE
Specific Gravity, UA: 1.02 (ref 1.005–1.030)
Urobilinogen, Ur: 0.2 mg/dL (ref 0.2–1.0)
pH, UA: 6.5 (ref 5.0–7.5)

## 2023-05-20 LAB — MICROSCOPIC EXAMINATION

## 2023-05-20 NOTE — Progress Notes (Signed)
Assessment: 1. Elevated PSA; negative biopsy 6/24   2. Family history of prostate cancer in father   3. BPH with obstruction/lower urinary tract symptoms     Plan: PSA today for continued monitoring of elevated PSA. I discussed the potential role of additional testing with urine biomarkers, prostate MRI, and repeat prostate biopsy. Return to office in 6 months  Chief Complaint:  Chief Complaint  Patient presents with   Elevated PSA    History of Present Illness:  Isaac Chan is a 67 y.o. male who is seen for continued evaluation of elevated PSA. He was found to have an elevated PSA. PSA results: 5/23 1.4 5/24 5.2 with 17.3% free  No prior history of elevated PSAs.  No history of UTIs or prostatitis. He does have a family history of prostate cancer with his father. He has lower urinary tract symptoms including frequency, urgency, nocturia x 2 and an intermittent stream.  No dysuria or gross hematuria. IPSS = 15.  He is s/p transrectal ultrasound and biopsy of the prostate on 11/26/22. TRUS volume:  34.7 ml  PSA density:  0.15 Biopsy results:             Benign prostate tissue, PIN left mid gland  Complications after biopsy: none  He returns today for scheduled follow-up.  He reports some frequency, urgency, and nocturia.  No dysuria or gross hematuria. IPSS = 8 today.  Portions of the above documentation were copied from a prior visit for review purposes only.  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   Dizziness 05/23/2020   Dyspnea on exertion 05/23/2020   Essential hypertension 05/23/2020   Hyperlipidemia    Shoulder injury, left, initial encounter 07/02/2017   Smoking 05/23/2020    Past Surgical History:  Past Surgical History:  Procedure Laterality Date   COLONOSCOPY     R wrist fx repaired     SHOULDER ARTHROSCOPY Left     Allergies:  No Known Allergies  Family History:  Family History  Problem  Relation Age of Onset   Prostate cancer Father    Colon cancer Neg Hx    Colon polyps Neg Hx    Esophageal cancer Neg Hx    Stomach cancer Neg Hx    Rectal cancer Neg Hx     Social History:  Social History   Tobacco Use   Smoking status: Former    Current packs/day: 0.00    Average packs/day: 0.3 packs/day for 46.0 years (11.5 ttl pk-yrs)    Types: Cigarettes    Start date: 07/10/1975    Quit date: 07/09/2021    Years since quitting: 1.8   Smokeless tobacco: Never  Vaping Use   Vaping status: Never Used  Substance Use Topics   Alcohol use: Yes    Comment: once day-beer   Drug use: Yes    Types: Marijuana    Comment: daily, last was 10/02/21 this week    ROS: Constitutional:  Negative for fever, chills, weight loss CV: Negative for chest pain, previous MI, hypertension Respiratory:  Negative for shortness of breath, wheezing, sleep apnea, frequent cough GI:  Negative for nausea, vomiting, bloody stool, GERD  Physical exam: BP 126/77   Pulse (!) 50   Ht 5\' 9"  (1.753 m)   Wt 144 lb (65.3 kg)   BMI 21.27 kg/m  GENERAL APPEARANCE:  Well appearing, well developed, well nourished, NAD HEENT:  Atraumatic, normocephalic, oropharynx clear NECK:  Supple without  lymphadenopathy or thyromegaly ABDOMEN:  Soft, non-tender, no masses EXTREMITIES:  Moves all extremities well, without clubbing, cyanosis, or edema NEUROLOGIC:  Alert and oriented x 3, normal gait, CN II-XII grossly intact MENTAL STATUS:  appropriate BACK:  Non-tender to palpation, No CVAT SKIN:  Warm, dry, and intact   Results: U/A: 0-5 WBC, 0 RBC

## 2023-05-21 ENCOUNTER — Encounter: Payer: Self-pay | Admitting: Urology

## 2023-05-21 LAB — PSA: Prostate Specific Ag, Serum: 2.3 ng/mL (ref 0.0–4.0)

## 2023-06-16 ENCOUNTER — Other Ambulatory Visit: Payer: Self-pay | Admitting: Cardiology

## 2023-06-24 ENCOUNTER — Ambulatory Visit (INDEPENDENT_AMBULATORY_CARE_PROVIDER_SITE_OTHER): Payer: PPO | Admitting: Urology

## 2023-06-24 ENCOUNTER — Encounter: Payer: Self-pay | Admitting: Urology

## 2023-06-24 VITALS — BP 111/71 | HR 50 | Ht 68.0 in | Wt 147.0 lb

## 2023-06-24 DIAGNOSIS — R972 Elevated prostate specific antigen [PSA]: Secondary | ICD-10-CM | POA: Diagnosis not present

## 2023-06-24 DIAGNOSIS — Z8042 Family history of malignant neoplasm of prostate: Secondary | ICD-10-CM | POA: Diagnosis not present

## 2023-06-24 DIAGNOSIS — N138 Other obstructive and reflux uropathy: Secondary | ICD-10-CM | POA: Insufficient documentation

## 2023-06-24 DIAGNOSIS — N401 Enlarged prostate with lower urinary tract symptoms: Secondary | ICD-10-CM | POA: Diagnosis not present

## 2023-06-24 DIAGNOSIS — R310 Gross hematuria: Secondary | ICD-10-CM | POA: Diagnosis not present

## 2023-06-24 NOTE — Progress Notes (Signed)
 Assessment: 1. Gross hematuria   2. Elevated PSA; negative biopsy 6/24   3. Family history of prostate cancer in father   4. BPH with obstruction/lower urinary tract symptoms     Plan: Today I had a discussion with the patient regarding the findings of gross hematuria including the implications and differential diagnoses associated with it.  I also discussed recommendations for further evaluation including the rationale for upper tract imaging and cystoscopy.  I discussed the nature of these procedures including potential risk and complications.  The patient expressed an understanding of these issues. Recommend further evaluation with CT hematuria protocol and cystoscopy. BMP today  Chief Complaint:  Chief Complaint  Patient presents with   Elevated PSA    History of Present Illness:  Isaac Chan is a 68 y.o. male who is seen for evaluation of gross hematuria. He had an episode of gross hematuria approximately 3 weeks ago.  The hematuria lasted for approximately 4 days and was intermittent.  He noted dark red urine without clots.  No associated symptoms including dysuria or flank pain.  The hematuria resolved spontaneously.  He has had no further episodes. He does have a prior history of tobacco use. IPSS = 9 today.  He was found to have an elevated PSA. PSA results: 5/23 1.4 5/24 5.2 with 17.3% free  No prior history of elevated PSAs.  No history of UTIs or prostatitis. He does have a family history of prostate cancer with his father. He has lower urinary tract symptoms including frequency, urgency, nocturia x 2 and an intermittent stream.  No dysuria or gross hematuria. IPSS = 15.  He underwent a transrectal ultrasound and biopsy of the prostate on 11/26/22. TRUS volume:  34.7 ml  PSA density:  0.15 Biopsy results:             Benign prostate tissue, PIN left mid gland  Complications after biopsy: none  PSA 12/24:  2.3  At his visit in 12/24, he reported some  frequency, urgency, and nocturia.  No dysuria or gross hematuria. IPSS = 8.  Portions of the above documentation were copied from a prior visit for review purposes only.  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   Dizziness 05/23/2020   Dyspnea on exertion 05/23/2020   Essential hypertension 05/23/2020   Hyperlipidemia    Shoulder injury, left, initial encounter 07/02/2017   Smoking 05/23/2020    Past Surgical History:  Past Surgical History:  Procedure Laterality Date   COLONOSCOPY     R wrist fx repaired     SHOULDER ARTHROSCOPY Left     Allergies:  No Known Allergies  Family History:  Family History  Problem Relation Age of Onset   Prostate cancer Father    Colon cancer Neg Hx    Colon polyps Neg Hx    Esophageal cancer Neg Hx    Stomach cancer Neg Hx    Rectal cancer Neg Hx     Social History:  Social History   Tobacco Use   Smoking status: Former    Current packs/day: 0.00    Average packs/day: 0.3 packs/day for 46.0 years (11.5 ttl pk-yrs)    Types: Cigarettes    Start date: 07/10/1975    Quit date: 07/09/2021    Years since quitting: 1.9   Smokeless tobacco: Never  Vaping Use   Vaping status: Never Used  Substance Use Topics   Alcohol use: Yes    Comment:  once day-beer   Drug use: Yes    Types: Marijuana    Comment: daily, last was 10/02/21 this week    ROS: Constitutional:  Negative for fever, chills, weight loss CV: Negative for chest pain, previous MI, hypertension Respiratory:  Negative for shortness of breath, wheezing, sleep apnea, frequent cough GI:  Negative for nausea, vomiting, bloody stool, GERD  Physical exam: BP 111/71   Pulse (!) 50   Ht 5\' 8"  (1.727 m)   Wt 147 lb (66.7 kg)   BMI 22.35 kg/m  GENERAL APPEARANCE:  Well appearing, well developed, well nourished, NAD HEENT:  Atraumatic, normocephalic, oropharynx clear NECK:  Supple without lymphadenopathy or thyromegaly ABDOMEN:   Soft, non-tender, no masses EXTREMITIES:  Moves all extremities well, without clubbing, cyanosis, or edema NEUROLOGIC:  Alert and oriented x 3, normal gait, CN II-XII grossly intact MENTAL STATUS:  appropriate BACK:  Non-tender to palpation, No CVAT SKIN:  Warm, dry, and intact   Results: U/A: 0-5 WBC, 0 RBC

## 2023-06-25 LAB — BASIC METABOLIC PANEL
BUN/Creatinine Ratio: 17 (ref 10–24)
BUN: 20 mg/dL (ref 8–27)
CO2: 19 mmol/L — ABNORMAL LOW (ref 20–29)
Calcium: 9.5 mg/dL (ref 8.6–10.2)
Chloride: 103 mmol/L (ref 96–106)
Creatinine, Ser: 1.2 mg/dL (ref 0.76–1.27)
Glucose: 100 mg/dL — ABNORMAL HIGH (ref 70–99)
Potassium: 4.7 mmol/L (ref 3.5–5.2)
Sodium: 138 mmol/L (ref 134–144)
eGFR: 66 mL/min/{1.73_m2} (ref >59)

## 2023-06-25 LAB — MICROSCOPIC EXAMINATION

## 2023-06-25 LAB — URINALYSIS, ROUTINE W REFLEX MICROSCOPIC
Bilirubin, UA: NEGATIVE
Glucose, UA: NEGATIVE
Ketones, UA: NEGATIVE
Nitrite, UA: NEGATIVE
Protein,UA: NEGATIVE
RBC, UA: NEGATIVE
Specific Gravity, UA: 1.025 (ref 1.005–1.030)
Urobilinogen, Ur: 0.2 mg/dL (ref 0.2–1.0)
pH, UA: 6 (ref 5.0–7.5)

## 2023-06-26 ENCOUNTER — Ambulatory Visit (HOSPITAL_BASED_OUTPATIENT_CLINIC_OR_DEPARTMENT_OTHER)
Admission: RE | Admit: 2023-06-26 | Discharge: 2023-06-26 | Disposition: A | Discharge status: Home / Self Care | Payer: PPO | Source: Ambulatory Visit | Attending: Urology | Admitting: Urology

## 2023-06-26 ENCOUNTER — Encounter (HOSPITAL_BASED_OUTPATIENT_CLINIC_OR_DEPARTMENT_OTHER): Payer: Self-pay | Admitting: *Deleted

## 2023-06-26 DIAGNOSIS — N3289 Other specified disorders of bladder: Secondary | ICD-10-CM | POA: Diagnosis not present

## 2023-06-26 DIAGNOSIS — R310 Gross hematuria: Secondary | ICD-10-CM | POA: Diagnosis not present

## 2023-06-26 DIAGNOSIS — N400 Enlarged prostate without lower urinary tract symptoms: Secondary | ICD-10-CM | POA: Diagnosis not present

## 2023-06-26 DIAGNOSIS — N281 Cyst of kidney, acquired: Secondary | ICD-10-CM | POA: Diagnosis not present

## 2023-06-26 DIAGNOSIS — R319 Hematuria, unspecified: Secondary | ICD-10-CM | POA: Diagnosis not present

## 2023-06-26 MED ORDER — IOHEXOL 300 MG/ML  SOLN
125.0000 mL | Freq: Once | INTRAMUSCULAR | Status: AC | PRN
  Administered 2023-06-26: 125 mL via INTRAVENOUS

## 2023-07-14 ENCOUNTER — Ambulatory Visit: Payer: PPO | Attending: Family Medicine

## 2023-07-14 VITALS — Ht 68.0 in | Wt 148.0 lb

## 2023-07-14 DIAGNOSIS — Z0000 Encounter for general adult medical examination without abnormal findings: Secondary | ICD-10-CM

## 2023-07-14 NOTE — Patient Instructions (Addendum)
 Mr. Riese , Thank you for taking time to come for your Medicare Wellness Visit. I appreciate your ongoing commitment to your health goals. Please review the following plan we discussed and let me know if I can assist you in the future.   Referrals/Orders/Follow-Ups/Clinician Recommendations: Yes; Keep maintaining your health by keeping your appointments with Dr. Newlin and any specialists that you may see.  Call us  if you need anything.  Have a great year!!!!  This is a list of the screening recommended for you and due dates:  Health Maintenance  Topic Date Due   COVID-19 Vaccine (1) Never done   DTaP/Tdap/Td vaccine (1 - Tdap) Never done   Zoster (Shingles) Vaccine (1 of 2) Never done   Flu Shot  Never done   Medicare Annual Wellness Visit  07/13/2024   Colon Cancer Screening  10/04/2024   Pneumonia Vaccine  Completed   Hepatitis C Screening  Completed   HPV Vaccine  Aged Out    Advanced directives: (Declined) Advance directive discussed with you today. Even though you declined this today, please call our office should you change your mind, and we can give you the proper paperwork for you to fill out.  Next Medicare Annual Wellness Visit scheduled for next year: Yes; It was nice speaking with you today! Your next Annual Wellness Visit is scheduled for 07/19/2024 at 8:40 a.m. via telephone. If you need to reschedule or cancel, please call [phone].

## 2023-07-14 NOTE — Progress Notes (Addendum)
 Subjective:   Isaac Chan is a 68 y.o. male who presents for Medicare Annual/Subsequent preventive examination.  Visit Complete: Virtual I connected with  Lynwood Kitty on 07/14/23 by a audio enabled telemedicine application and verified that I am speaking with the correct person using two identifiers.  Patient Location: Home  Provider Location: Office/Clinic  I discussed the limitations of evaluation and management by telemedicine. The patient expressed understanding and agreed to proceed.  Vital Signs: Because this visit was a virtual/telehealth visit, some criteria may be missing or patient reported. Any vitals not documented were not able to be obtained and vitals that have been documented are patient reported.  Patient Medicare AWV questionnaire was completed by the patient on 07/10/2023; I have confirmed that all information answered by patient is correct and no changes since this date.  Cardiac Risk Factors include: advanced age (>70men, >73 women);dyslipidemia;hypertension;male gender;sedentary lifestyle;smoking/ tobacco exposure     Objective:    Today's Vitals   07/14/23 0855 07/14/23 0856  Weight: 148 lb (67.1 kg)   Height: 5' 8 (1.727 m)   PainSc: 3  3   PainLoc: Back    Body mass index is 22.5 kg/m.     07/14/2023    8:52 AM 11/06/2022    8:47 AM 08/18/2022    2:42 PM 09/09/2021   12:09 PM 12/07/2020   10:11 AM 05/17/2020    7:58 AM 12/17/2017    1:20 PM  Advanced Directives  Does Patient Have a Medical Advance Directive? No No No No No No No  Would patient like information on creating a medical advance directive? No - Patient declined No - Patient declined  No - Patient declined No - Patient declined  No - Patient declined    Current Medications (verified) Outpatient Encounter Medications as of 07/14/2023  Medication Sig   amLODipine  (NORVASC ) 10 MG tablet Take 1 tablet (10 mg total) by mouth daily. TAKE 1 TABLET(10 MG) BY MOUTH DAILY   atorvastatin  (LIPITOR) 40  MG tablet Take 1 tablet (40 mg total) by mouth daily.   gabapentin  (NEURONTIN ) 300 MG capsule Take 1 capsule (300 mg total) by mouth 3 (three) times daily.   losartan -hydrochlorothiazide  (HYZAAR) 100-25 MG tablet Take 1 tablet by mouth daily.   spironolactone  (ALDACTONE ) 25 MG tablet Take 1 tablet (25 mg total) by mouth daily.   tiZANidine  (ZANAFLEX ) 4 MG tablet Take 1 tablet (4 mg total) by mouth every 6 (six) hours as needed for up to 15 doses for muscle spasms.   No facility-administered encounter medications on file as of 07/14/2023.    Allergies (verified) Patient has no known allergies.   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   Dizziness 05/23/2020   Dyspnea on exertion 05/23/2020   Essential hypertension 05/23/2020   Hyperlipidemia    Shoulder injury, left, initial encounter 07/02/2017   Smoking 05/23/2020   Past Surgical History:  Procedure Laterality Date   COLONOSCOPY     R wrist fx repaired     SHOULDER ARTHROSCOPY Left    Family History  Problem Relation Age of Onset   Prostate cancer Father    Colon cancer Neg Hx    Colon polyps Neg Hx    Esophageal cancer Neg Hx    Stomach cancer Neg Hx    Rectal cancer Neg Hx    Social History   Socioeconomic History   Marital status: Married    Spouse name: Not on file   Number of  children: Not on file   Years of education: Not on file   Highest education level: Not on file  Occupational History   Not on file  Tobacco Use   Smoking status: Former    Current packs/day: 0.00    Average packs/day: 0.3 packs/day for 46.0 years (11.5 ttl pk-yrs)    Types: Cigarettes    Start date: 07/10/1975    Quit date: 07/09/2021    Years since quitting: 2.0   Smokeless tobacco: Never  Vaping Use   Vaping status: Never Used  Substance and Sexual Activity   Alcohol use: Yes    Comment: once day-beer   Drug use: Yes    Types: Marijuana    Comment: daily, last was 10/02/21 this week   Sexual  activity: Yes  Other Topics Concern   Not on file  Social History Narrative   Not on file   Social Drivers of Health   Financial Resource Strain: Low Risk  (07/14/2023)   Overall Financial Resource Strain (CARDIA)    Difficulty of Paying Living Expenses: Not hard at all  Food Insecurity: No Food Insecurity (07/14/2023)   Hunger Vital Sign    Worried About Running Out of Food in the Last Year: Never true    Ran Out of Food in the Last Year: Never true  Transportation Needs: No Transportation Needs (07/14/2023)   PRAPARE - Administrator, Civil Service (Medical): No    Lack of Transportation (Non-Medical): No  Physical Activity: Inactive (07/14/2023)   Exercise Vital Sign    Days of Exercise per Week: 0 days    Minutes of Exercise per Session: 0 min  Stress: No Stress Concern Present (07/14/2023)   [employer] of Occupational Health - Occupational Stress Questionnaire    Feeling of Stress : Not at all  Social Connections: Socially Integrated (07/14/2023)   Social Connection and Isolation Panel [NHANES]    Frequency of Communication with Friends and Family: More than three times a week    Frequency of Social Gatherings with Friends and Family: More than three times a week    Attends Religious Services: More than 4 times per year    Active Member of [employer] or Organizations: Yes    Attends Engineer, Structural: More than 4 times per year    Marital Status: Married    Tobacco Counseling Counseling given: Not Answered   Clinical Intake:  Pre-visit preparation completed: Yes  Pain : 0-10 Pain Score: 3  Pain Type: Chronic pain Pain Location: Back Pain Orientation: Lower     BMI - recorded: 22.5 Nutritional Status: BMI of 19-24  Normal Nutritional Risks: None Diabetes: No  How often do you need to have someone help you when you read instructions, pamphlets, or other written materials from your doctor or pharmacy?: 1 - Never What is the last grade level you  completed in school?: 11th GRADE  Interpreter Needed?: No  Information entered by :: Roz Fuller, LPN.   Activities of Daily Living    07/14/2023    9:01 AM 07/10/2023    7:34 PM  In your present state of health, do you have any difficulty performing the following activities:  Hearing? 0 0  Vision? 0 0  Difficulty concentrating or making decisions? 0 0  Walking or climbing stairs? 0 0  Dressing or bathing? 0 0  Doing errands, shopping? 0 0  Preparing Food and eating ? N N  Using the Toilet? N N  In the  past six months, have you accidently leaked urine? N N  Do you have problems with loss of bowel control? N N  Managing your Medications? N N  Managing your Finances? N N  Housekeeping or managing your Housekeeping? N N    Patient Care Team: Delbert Clam, MD as PCP - General (Family Medicine) [facility], [facility] as Referring Physician (Optometry)  Indicate any recent Medical Services you may have received from other than [facility] providers in the past year (date may be approximate).     Assessment:   This is a routine wellness examination for Baron.  Hearing/Vision screen Hearing Screening - Comments:: Patient denied any hearing difficulty.   No hearing aids.  Vision Screening - Comments:: Patient wears corrective lenses.  Annual eye exam done by: [facility]    Goals Addressed             This Visit's Progress    To get rid of this lower back pain.       Exercises that can help with lower back pain include:  knee-to-chest stretches, pelvic tilts, cat-cow pose, back extensions (like the cobra pose), bridge exercises, side-lying rotations, hamstring stretches, and gentle spinal rotations, all performed with proper form and mindful breathing to avoid further strain.       Depression Screen    07/14/2023    8:52 AM 10/28/2022    3:35 PM 08/18/2022    2:42 PM 08/12/2021    1:43 PM 05/13/2021    8:40 AM 12/07/2020   10:06 AM 11/12/2020    8:45 AM  PHQ 2/9  Scores  PHQ - 2 Score 0 0 0 0 0 0 0  PHQ- 9 Score 0 0  0 0  0    Fall Risk    07/14/2023    9:01 AM 07/10/2023    7:34 PM 10/28/2022    3:35 PM 08/18/2022    2:42 PM 08/12/2021    1:40 PM  Fall Risk   Falls in the past year? 0 0 0 0 0  Number falls in past yr: 0 0 0 0 0  Injury with Fall? 0 0 0 0 0  Risk for fall due to : No Fall Risks  No Fall Risks Medication side effect   Follow up Falls prevention discussed   Falls prevention discussed;Education provided;Falls evaluation completed     MEDICARE RISK AT HOME: Medicare Risk at Home Any stairs in or around the home?: Yes If so, are there any without handrails?: No Home free of loose throw rugs in walkways, pet beds, electrical cords, etc?: No Adequate lighting in your home to reduce risk of falls?: Yes Life alert?: No Use of a cane, walker or w/c?: No Grab bars in the bathroom?: No Shower chair or bench in shower?: No Elevated toilet seat or a handicapped toilet?: No  TIMED UP AND GO:  Was the test performed?  No    Cognitive Function:    07/14/2023    8:53 AM 12/07/2020   10:13 AM  MMSE - Mini Mental State Exam  Not completed: Unable to complete   Orientation to time  5  Orientation to Place  5  Registration  3  Attention/ Calculation  5  Recall  3  Language- name 2 objects  2  Language- repeat  1  Language- follow 3 step command  3  Language- read & follow direction  1  Write a sentence  1  Copy design  1  Total score  30        07/14/2023    8:53 AM 08/18/2022    2:43 PM  6CIT Screen  What Year? 0 points 0 points  What month? 0 points 0 points  What time? 0 points 0 points  Count back from 20 0 points 0 points  Months in reverse 0 points 0 points  Repeat phrase 0 points 0 points  Total Score 0 points 0 points    Immunizations Immunization History  Administered Date(s) Administered   PNEUMOCOCCAL CONJUGATE-20 05/13/2021    TDAP status: Due, Education has been provided regarding the importance of this  vaccine. Advised may receive this vaccine at local pharmacy or Health Dept. Aware to provide a copy of the vaccination record if obtained from local pharmacy or Health Dept. Verbalized acceptance and understanding.  Flu Vaccine status: Declined, Education has been provided regarding the importance of this vaccine but patient still declined. Advised may receive this vaccine at local pharmacy or Health Dept. Aware to provide a copy of the vaccination record if obtained from local pharmacy or Health Dept. Verbalized acceptance and understanding.  Pneumococcal vaccine status: Up to date  Covid-19 vaccine status: Declined, Education has been provided regarding the importance of this vaccine but patient still declined. Advised may receive this vaccine at local pharmacy or Health Dept.or vaccine clinic. Aware to provide a copy of the vaccination record if obtained from local pharmacy or Health Dept. Verbalized acceptance and understanding.  Qualifies for Shingles Vaccine? Yes   Zostavax completed No   Shingrix Completed?: No.    Education has been provided regarding the importance of this vaccine. Patient has been advised to call insurance company to determine out of pocket expense if they have not yet received this vaccine. Advised may also receive vaccine at local pharmacy or Health Dept. Verbalized acceptance and understanding.  Screening Tests Health Maintenance  Topic Date Due   COVID-19 Vaccine (1) Never done   DTaP/Tdap/Td (1 - Tdap) Never done   Zoster Vaccines- Shingrix (1 of 2) Never done   INFLUENZA VACCINE  Never done   Medicare Annual Wellness (AWV)  07/13/2024   Colonoscopy  10/04/2024   Pneumonia Vaccine 76+ Years old  Completed   Hepatitis C Screening  Completed   HPV VACCINES  Aged Out    Health Maintenance  Health Maintenance Due  Topic Date Due   COVID-19 Vaccine (1) Never done   DTaP/Tdap/Td (1 - Tdap) Never done   Zoster Vaccines- Shingrix (1 of 2) Never done    INFLUENZA VACCINE  Never done    Colorectal cancer screening: Type of screening: Colonoscopy. Completed 10/04/2021. Repeat every 3 years  Lung Cancer Screening: (Low Dose CT Chest recommended if Age 74-80 years, 20 pack-year currently smoking OR have quit w/in 15years.) does not qualify.   Lung Cancer Screening Referral: no  Additional Screening:  Hepatitis C Screening: does qualify; Completed 05/13/2021  Vision Screening: Recommended annual ophthalmology exams for early detection of glaucoma and other disorders of the eye. Is the patient up to date with their annual eye exam?  Yes  Who is the provider or what is the name of the office in which the patient attends annual eye exams? [facility] If pt is not established with a provider, would they like to be referred to a provider to establish care? No .   Dental Screening: Recommended annual dental exams for proper oral hygiene  Community Resource Referral / Chronic Care Management: CRR required this visit?  No  CCM required this visit?  No     Plan:     I have personally reviewed and noted the following in the patient's chart:   Medical and social history Use of alcohol, tobacco or illicit drugs  Current medications and supplements including opioid prescriptions. Patient is not currently taking opioid prescriptions. Functional ability and status Nutritional status Physical activity Advanced directives List of other physicians Hospitalizations, surgeries, and ER visits in previous 12 months Vitals Screenings to include cognitive, depression, and falls Referrals and appointments  In addition, I have reviewed and discussed with patient certain preventive protocols, quality metrics, and best practice recommendations. A written personalized care plan for preventive services as well as general preventive health recommendations were provided to patient.     Roz LOISE Fuller, LPN   12/11/7972   After Visit Summary: (Pick  Up) Due to this being a telephonic visit, with patients personalized plan was offered to patient and patient has requested to Pick up at office.  Nurse Notes: None at this time.

## 2023-07-16 ENCOUNTER — Encounter: Payer: Self-pay | Admitting: Urology

## 2023-07-16 ENCOUNTER — Other Ambulatory Visit: Payer: Self-pay | Admitting: Cardiology

## 2023-07-16 ENCOUNTER — Ambulatory Visit (INDEPENDENT_AMBULATORY_CARE_PROVIDER_SITE_OTHER): Payer: PPO | Admitting: Urology

## 2023-07-16 VITALS — BP 135/84 | HR 48 | Ht 68.0 in | Wt 148.0 lb

## 2023-07-16 DIAGNOSIS — R310 Gross hematuria: Secondary | ICD-10-CM | POA: Diagnosis not present

## 2023-07-16 DIAGNOSIS — Z8042 Family history of malignant neoplasm of prostate: Secondary | ICD-10-CM | POA: Diagnosis not present

## 2023-07-16 DIAGNOSIS — N138 Other obstructive and reflux uropathy: Secondary | ICD-10-CM | POA: Diagnosis not present

## 2023-07-16 DIAGNOSIS — N281 Cyst of kidney, acquired: Secondary | ICD-10-CM

## 2023-07-16 DIAGNOSIS — N401 Enlarged prostate with lower urinary tract symptoms: Secondary | ICD-10-CM | POA: Diagnosis not present

## 2023-07-16 DIAGNOSIS — R972 Elevated prostate specific antigen [PSA]: Secondary | ICD-10-CM | POA: Diagnosis not present

## 2023-07-16 NOTE — Progress Notes (Signed)
 Assessment: 1. Gross hematuria   2. Elevated PSA; negative biopsy 6/24   3. Family history of prostate cancer in father   4. BPH with obstruction/lower urinary tract symptoms   5. Renal cyst; left; Bosniak 1     Plan: I personally reviewed the CT study from 07/06/2023 with results as noted below.  Results discussed with the patient today.  Findings of benign left renal cyst discussed.  No further evaluation indicated for the cyst. Urine cytology sent today Cipro x 1 following cystoscopy Return to office in 4 months  Chief Complaint:  Chief Complaint  Patient presents with   Hematuria    History of Present Illness:  Isaac Chan is a 68 y.o. male who is seen for continued evaluation of gross hematuria.  Gross hematuria: He had an episode of gross hematuria approximately 3 weeks ago.  The hematuria lasted for approximately 4 days and was intermittent.  He noted dark red urine without clots.  No associated symptoms including dysuria or flank pain.  The hematuria resolved spontaneously.  He has had no further episodes. He does have a prior history of tobacco use. IPSS = 9. Urinalysis showed no evidence of RBCs. CT hematuria protocol from 06/26/2023 showed no solid renal masses, simple appearing left renal cyst, no renal or ureteral calculi, no evidence of obstruction or filling defect. He presents today for further evaluation with cystoscopy. No further episodes of gross hematuria.  No new lower urinary tract symptoms. IPSS = 8.   Elevated PSA: He was found to have an elevated PSA. PSA results: 5/23 1.4 5/24 5.2 with 17.3% free  No prior history of elevated PSAs.  No history of UTIs or prostatitis. He does have a family history of prostate cancer with his father. He has lower urinary tract symptoms including frequency, urgency, nocturia x 2 and an intermittent stream.  No dysuria or gross hematuria. IPSS = 15.  He underwent a transrectal ultrasound and biopsy of the  prostate on 11/26/22. TRUS volume:  34.7 ml  PSA density:  0.15 Biopsy results:             Benign prostate tissue, PIN left mid gland  Complications after biopsy: none  PSA 12/24:  2.3  BPH with LUTS: At his visit in 12/24, he reported some frequency, urgency, and nocturia.  No dysuria or gross hematuria. IPSS = 8.  Portions of the above documentation were copied from a prior visit for review purposes only.  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   Dizziness 05/23/2020   Dyspnea on exertion 05/23/2020   Essential hypertension 05/23/2020   Hyperlipidemia    Shoulder injury, left, initial encounter 07/02/2017   Smoking 05/23/2020    Past Surgical History:  Past Surgical History:  Procedure Laterality Date   COLONOSCOPY     R wrist fx repaired     SHOULDER ARTHROSCOPY Left     Allergies:  No Known Allergies  Family History:  Family History  Problem Relation Age of Onset   Prostate cancer Father    Colon cancer Neg Hx    Colon polyps Neg Hx    Esophageal cancer Neg Hx    Stomach cancer Neg Hx    Rectal cancer Neg Hx     Social History:  Social History   Tobacco Use   Smoking status: Former    Current packs/day: 0.00    Average packs/day: 0.3 packs/day for 46.0 years (11.5 ttl pk-yrs)  Types: Cigarettes    Start date: 07/10/1975    Quit date: 07/09/2021    Years since quitting: 2.0   Smokeless tobacco: Never  Vaping Use   Vaping status: Never Used  Substance Use Topics   Alcohol use: Yes    Comment: once day-beer   Drug use: Yes    Types: Marijuana    Comment: daily, last was 10/02/21 this week    ROS: Constitutional:  Negative for fever, chills, weight loss CV: Negative for chest pain, previous MI, hypertension Respiratory:  Negative for shortness of breath, wheezing, sleep apnea, frequent cough GI:  Negative for nausea, vomiting, bloody stool, GERD  Physical exam: BP 135/84   Pulse (!) 48   Ht  5' 8 (1.727 m)   Wt 148 lb (67.1 kg)   BMI 22.50 kg/m  GENERAL APPEARANCE:  Well appearing, well developed, well nourished, NAD HEENT:  Atraumatic, normocephalic, oropharynx clear NECK:  Supple without lymphadenopathy or thyromegaly ABDOMEN:  Soft, non-tender, no masses EXTREMITIES:  Moves all extremities well, without clubbing, cyanosis, or edema NEUROLOGIC:  Alert and oriented x 3, normal gait, CN II-XII grossly intact MENTAL STATUS:  appropriate BACK:  Non-tender to palpation, No CVAT SKIN:  Warm, dry, and intact   Results: U/A: negative  Procedure:  Flexible Cystourethroscopy  Pre-operative Diagnosis: Gross hematuria  Post-operative Diagnosis: Gross hematuria  Anesthesia:  local with lidocaine jelly  Surgical Narrative:  After appropriate informed consent was obtained, the patient was prepped and draped in the usual sterile fashion in the supine position.  The patient was correctly identified and the proper procedure delineated prior to proceeding.  Sterile lidocaine gel was instilled in the urethra. The flexible cystoscope was introduced without difficulty.  Findings:  Anterior urethra: Normal  Posterior urethra: Lateral lobe hypertrophy with small median lobe  Bladder:  No mucosal lesions seen; few trabeculations  Ureteral orifices: normal  Additional findings:  None  Saline bladder wash for cytology was performed.    The cystoscope was then removed.  The patient tolerated the procedure well.

## 2023-07-17 LAB — URINALYSIS, ROUTINE W REFLEX MICROSCOPIC
Bilirubin, UA: NEGATIVE
Glucose, UA: NEGATIVE
Ketones, UA: NEGATIVE
Leukocytes,UA: NEGATIVE
Nitrite, UA: NEGATIVE
Protein,UA: NEGATIVE
RBC, UA: NEGATIVE
Specific Gravity, UA: 1.02 (ref 1.005–1.030)
Urobilinogen, Ur: 1 mg/dL (ref 0.2–1.0)
pH, UA: 7.5 (ref 5.0–7.5)

## 2023-07-21 ENCOUNTER — Encounter: Payer: Self-pay | Admitting: Urology

## 2023-08-15 ENCOUNTER — Other Ambulatory Visit: Payer: Self-pay | Admitting: Cardiology

## 2023-08-20 ENCOUNTER — Telehealth: Payer: Self-pay | Admitting: Cardiology

## 2023-08-20 MED ORDER — SPIRONOLACTONE 25 MG PO TABS: 25.0000 mg | ORAL_TABLET | Freq: Every day | ORAL | 1 refills | Status: DC

## 2023-08-20 NOTE — Telephone Encounter (Signed)
 Pt's medication was sent to pt's pharmacy as requested. Confirmation received.

## 2023-08-20 NOTE — Telephone Encounter (Signed)
*  STAT* If patient is at the pharmacy, call can be transferred to refill team.   1. Which medications need to be refilled? (please list name of each medication and dose if known)   spironolactone (ALDACTONE) 25 MG tablet   2. Which pharmacy/location (including street and city if local pharmacy) is medication to be sent to? [id number] - [place], [place] - [address] Phone: [phone]  Fax: [phone]     3. Do they need a 30 day or 90 day supply? 90

## 2023-09-14 ENCOUNTER — Ambulatory Visit: Payer: PPO | Attending: Family Medicine | Admitting: Family Medicine

## 2023-09-14 ENCOUNTER — Encounter: Payer: Self-pay | Admitting: Family Medicine

## 2023-09-14 VITALS — BP 125/69 | HR 44 | Ht 68.0 in | Wt 148.6 lb

## 2023-09-14 DIAGNOSIS — R001 Bradycardia, unspecified: Secondary | ICD-10-CM | POA: Diagnosis not present

## 2023-09-14 DIAGNOSIS — M79651 Pain in right thigh: Secondary | ICD-10-CM

## 2023-09-14 DIAGNOSIS — I10 Essential (primary) hypertension: Secondary | ICD-10-CM | POA: Diagnosis not present

## 2023-09-14 DIAGNOSIS — I440 Atrioventricular block, first degree: Secondary | ICD-10-CM | POA: Insufficient documentation

## 2023-09-14 DIAGNOSIS — M79652 Pain in left thigh: Secondary | ICD-10-CM

## 2023-09-14 DIAGNOSIS — M5442 Lumbago with sciatica, left side: Secondary | ICD-10-CM | POA: Diagnosis not present

## 2023-09-14 DIAGNOSIS — M5441 Lumbago with sciatica, right side: Secondary | ICD-10-CM

## 2023-09-14 DIAGNOSIS — E7800 Pure hypercholesterolemia, unspecified: Secondary | ICD-10-CM

## 2023-09-14 DIAGNOSIS — G8929 Other chronic pain: Secondary | ICD-10-CM

## 2023-09-14 MED ORDER — LOSARTAN POTASSIUM-HCTZ 100-25 MG PO TABS: 1.0000 | ORAL_TABLET | Freq: Every day | ORAL | 1 refills | Status: DC

## 2023-09-14 MED ORDER — ATORVASTATIN CALCIUM 40 MG PO TABS
40.0000 mg | ORAL_TABLET | Freq: Every day | ORAL | 1 refills | Status: DC
Start: 2023-09-14 — End: 2024-03-29

## 2023-09-14 MED ORDER — GABAPENTIN 300 MG PO CAPS: 300.0000 mg | ORAL_CAPSULE | Freq: Three times a day (TID) | ORAL | 1 refills | Status: DC

## 2023-09-14 MED ORDER — AMLODIPINE BESYLATE 10 MG PO TABS
10.0000 mg | ORAL_TABLET | Freq: Every day | ORAL | 2 refills | Status: DC
Start: 2023-09-14 — End: 2024-03-29

## 2023-09-14 MED ORDER — TIZANIDINE HCL 4 MG PO TABS: 4.0000 mg | ORAL_TABLET | Freq: Three times a day (TID) | ORAL | 2 refills | Status: DC | PRN

## 2023-09-14 NOTE — Progress Notes (Signed)
 Subjective:  Patient ID: Isaac Chan, male    DOB: 1955-11-25  Age: 68 y.o. MRN: [id number]  CC: Medical Management of Chronic Issues (Leg pain)     Discussed the use of AI scribe software for clinical note transcription with the patient, who gave verbal consent to proceed.  History of Present Illness The patient, with a history of nicotine dependence, hypertension, hyperlipidemia (6 cigarettes on and off per day x 30 years), THC use presents with persistent leg pain. He describes the pain as a 'nagging ache' in his thighs that is worse in the morning and after prolonged standing or walking. The pain is not sharp or shooting, but it is bothersome enough to interrupt his daily activities, such as walking around the block. The patient has been taking gabapentin (300mg  three times a day) and tizanidine (as needed), but he does not feel that these medications are effectively managing his pain. He has tried physical therapy in the past, but did not find it helpful.  The patient also has a history of back pain, which is currently controlled with a back brace.  He has not been experiencing any chest pain. He has an upcoming appointment with his cardiologist later this month.  He is adherent with his antihypertensive and his statin and his heart rate was initially 42 in the office and subsequently was 44 upon recheck.    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   Dizziness 05/23/2020   Dyspnea on exertion 05/23/2020   Essential hypertension 05/23/2020   Hyperlipidemia    Shoulder injury, left, initial encounter 07/02/2017   Smoking 05/23/2020    Past Surgical History:  Procedure Laterality Date   COLONOSCOPY     R wrist fx repaired     SHOULDER ARTHROSCOPY Left     Family History  Problem Relation Age of Onset   Prostate cancer Father    Colon cancer Neg Hx    Colon polyps Neg Hx    Esophageal cancer Neg Hx    Stomach cancer Neg Hx    Rectal cancer Neg Hx      Social History   Socioeconomic History   Marital status: Married    Spouse name: Not on file   Number of children: Not on file   Years of education: Not on file   Highest education level: Not on file  Occupational History   Not on file  Tobacco Use   Smoking status: Former    Current packs/day: 0.00    Average packs/day: 0.3 packs/day for 46.0 years (11.5 ttl pk-yrs)    Types: Cigarettes    Start date: 07/10/1975    Quit date: 07/09/2021    Years since quitting: 2.1   Smokeless tobacco: Never  Vaping Use   Vaping status: Never Used  Substance and Sexual Activity   Alcohol use: Yes    Comment: once day-beer   Drug use: Yes    Types: Marijuana    Comment: daily, last was 10/02/21 this week   Sexual activity: Yes  Other Topics Concern   Not on file  Social History Narrative   Not on file   Social Drivers of Health   Financial Resource Strain: Low Risk  (07/14/2023)   Overall Financial Resource Strain (CARDIA)    Difficulty of Paying Living Expenses: Not hard at all  Food Insecurity: No Food Insecurity (07/14/2023)   Hunger Vital Sign    Worried About Running Out of Food in the Last Year: Never  true    Ran Out of Food in the Last Year: Never true  Transportation Needs: No Transportation Needs (07/14/2023)   PRAPARE - Administrator, Civil Service (Medical): No    Lack of Transportation (Non-Medical): No  Physical Activity: Inactive (07/14/2023)   Exercise Vital Sign    Days of Exercise per Week: 0 days    Minutes of Exercise per Session: 0 min  Stress: No Stress Concern Present (07/14/2023)   [employer] of Occupational Health - Occupational Stress Questionnaire    Feeling of Stress : Not at all  Social Connections: Socially Integrated (07/14/2023)   Social Connection and Isolation Panel [NHANES]    Frequency of Communication with Friends and Family: More than three times a week    Frequency of Social Gatherings with Friends and Family: More than three  times a week    Attends Religious Services: More than 4 times per year    Active Member of [employer] or Organizations: Yes    Attends Engineer, structural: More than 4 times per year    Marital Status: Married    No Known Allergies  Outpatient Medications Prior to Visit  Medication Sig Dispense Refill   spironolactone (ALDACTONE) 25 MG tablet Take 1 tablet (25 mg total) by mouth daily. 30 tablet 1   amLODipine (NORVASC) 10 MG tablet Take 1 tablet (10 mg total) by mouth daily. TAKE 1 TABLET(10 MG) BY MOUTH DAILY 90 tablet 2   atorvastatin (LIPITOR) 40 MG tablet Take 1 tablet (40 mg total) by mouth daily. 90 tablet 1   gabapentin (NEURONTIN) 300 MG capsule Take 1 capsule (300 mg total) by mouth 3 (three) times daily. 270 capsule 1   losartan-hydrochlorothiazide (HYZAAR) 100-25 MG tablet Take 1 tablet by mouth daily. 90 tablet 1   tiZANidine (ZANAFLEX) 4 MG tablet Take 1 tablet (4 mg total) by mouth every 6 (six) hours as needed for up to 15 doses for muscle spasms. 15 tablet 0   No facility-administered medications prior to visit.     ROS Review of Systems  Constitutional:  Negative for activity change and appetite change.  HENT:  Negative for sinus pressure and sore throat.   Respiratory:  Negative for chest tightness, shortness of breath and wheezing.   Cardiovascular:  Negative for chest pain and palpitations.  Gastrointestinal:  Negative for abdominal distention, abdominal pain and constipation.  Genitourinary: Negative.   Musculoskeletal: Negative.   Psychiatric/Behavioral:  Negative for behavioral problems and dysphoric mood.     Objective:  BP 125/69   Pulse (!) 44   Ht 5\' 8"  (1.727 m)   Wt 148 lb 9.6 oz (67.4 kg)   SpO2 100%   BMI 22.59 kg/m      09/14/2023    1:43 PM 07/16/2023   10:54 AM 07/14/2023    8:55 AM  BP/Weight  Systolic BP 125 135   Diastolic BP 69 84   Wt. (Lbs) 148.6 148 148  BMI 22.59 kg/m2 22.5 kg/m2 22.5 kg/m2      Physical  Exam Constitutional:      Appearance: He is well-developed.  Cardiovascular:     Rate and Rhythm: Bradycardia present.     Heart sounds: Normal heart sounds. No murmur heard. Pulmonary:     Effort: Pulmonary effort is normal.     Breath sounds: Normal breath sounds. No wheezing or rales.  Chest:     Chest wall: No tenderness.  Abdominal:     General: Bowel sounds  are normal. There is no distension.     Palpations: Abdomen is soft. There is no mass.     Tenderness: There is no abdominal tenderness.  Musculoskeletal:     Right lower leg: No edema.     Left lower leg: No edema.     Comments: Wearing a back brace Slightly limited range of motion of the lumbar spine No tenderness on palpation of bilateral thighs.  Negative straight leg raise bilaterally  Neurological:     Mental Status: He is alert and oriented to person, place, and time.  Psychiatric:        Mood and Affect: Mood normal.        Latest Ref Rng & Units 06/24/2023   10:17 AM 10/28/2022    4:23 PM 08/12/2021    2:19 PM  CMP  Glucose 70 - 99 mg/dL 784  82  91   BUN 8 - 27 mg/dL 20  22  22    Creatinine 0.76 - 1.27 mg/dL 6.96  2.95  2.84   Sodium 134 - 144 mmol/L 138  135  139   Potassium 3.5 - 5.2 mmol/L 4.7  4.4  4.5   Chloride 96 - 106 mmol/L 103  99  102   CO2 20 - 29 mmol/L 19  22  23    Calcium 8.6 - 10.2 mg/dL 9.5  9.2  9.7   Total Protein 6.0 - 8.5 g/dL  7.0  7.4   Total Bilirubin 0.0 - 1.2 mg/dL  0.3  0.3   Alkaline Phos 44 - 121 IU/L  60  45   AST 0 - 40 IU/L  29  28   ALT 0 - 44 IU/L  21  26     Lipid Panel     Component Value Date/Time   CHOL 175 10/16/2021 1356   TRIG 59 10/16/2021 1356   HDL 99 10/16/2021 1356   CHOLHDL 2.8 06/21/2020 0850   LDLCALC 64 10/16/2021 1356    CBC    Component Value Date/Time   WBC 4.3 10/28/2022 1623   WBC 5.0 05/17/2020 0806   RBC 4.37 10/28/2022 1623   RBC 5.88 (H) 05/17/2020 0806   HGB 11.5 (L) 10/28/2022 1623   HCT 34.7 (L) 10/28/2022 1623   PLT 219  10/28/2022 1623   MCV 79 10/28/2022 1623   MCH 26.3 (L) 10/28/2022 1623   MCH 27.6 05/17/2020 0806   MCHC 33.1 10/28/2022 1623   MCHC 36.1 (H) 05/17/2020 0806   RDW 15.0 10/28/2022 1623   LYMPHSABS 1.6 10/28/2022 1623   MONOABS 0.5 05/17/2020 0806   EOSABS 0.1 10/28/2022 1623   BASOSABS 0.0 10/28/2022 1623    No results found for: "HGBA1C"     Assessment & Plan Chronic bilateral low back pain with bilateral sciatica Persistent low back pain radiating to thighs. Gabapentin ineffective, tizanidine underutilized. Prefers medication over physical therapy. - Refill gabapentin 300 mg TID. - Prescribe tizanidine every 8 hours as needed. - Continue use of back brace. - Consider addition of duloxetine if symptoms persist  Bradycardia with first degree AV block Heart rate 44 bpm, EKG showed first degree AV block and possible septal infarct which is new compared to previous EKG asymptomatic. Immediate cardiology evaluation recommended. - Recheck heart rate. - Perform EKG. - Instruct to deliver EKG results to cardiologist for earlier evaluation.   Hypertension - Controlled - Continue antihypertensive -Counseled on blood pressure goal of less than 130/80, low-sodium, DASH diet, medication compliance, 150 minutes of moderate  intensity exercise per week. Discussed medication compliance, adverse effects.    Hyperlipidemia - Controlled - Continue statin - Low-cholesterol diet  Meds ordered this encounter  Medications   amLODipine (NORVASC) 10 MG tablet    Sig: Take 1 tablet (10 mg total) by mouth daily. TAKE 1 TABLET(10 MG) BY MOUTH DAILY    Dispense:  90 tablet    Refill:  2   atorvastatin (LIPITOR) 40 MG tablet    Sig: Take 1 tablet (40 mg total) by mouth daily.    Dispense:  90 tablet    Refill:  1   gabapentin (NEURONTIN) 300 MG capsule    Sig: Take 1 capsule (300 mg total) by mouth 3 (three) times daily.    Dispense:  270 capsule    Refill:  1    losartan-hydrochlorothiazide (HYZAAR) 100-25 MG tablet    Sig: Take 1 tablet by mouth daily.    Dispense:  90 tablet    Refill:  1   tiZANidine (ZANAFLEX) 4 MG tablet    Sig: Take 1 tablet (4 mg total) by mouth every 8 (eight) hours as needed for muscle spasms.    Dispense:  60 tablet    Refill:  2    Follow-up: Return in about 6 months (around 03/15/2024).       Hoy Register, MD, FAAFP. [facility] [place], [place] [phone]   09/14/2023, 5:43 PM

## 2023-09-14 NOTE — Patient Instructions (Signed)
 VISIT SUMMARY:  You came in today because of persistent leg pain and a history of elevated PSA and hematuria. We discussed your ongoing leg pain, which is a nagging ache in your thighs, and your current medications. We also reviewed your back pain management and noted your upcoming cardiology appointment.  YOUR PLAN:  -CHRONIC BILATERAL LOW BACK PAIN WITH BILATERAL SCIATICA: This condition involves persistent pain in your lower back that radiates to your thighs. We will continue your current medication of gabapentin 300 mg three times a day and prescribe tizanidine to be taken every 8 hours as needed. Please continue using your back brace as well.  -BRADYCARDIA WITH FIRST DEGREE AV BLOCK: This condition means your heart rate is slower than normal and there is a delay in the electrical signals in your heart. Although you are not experiencing symptoms, it is important to monitor this closely. We will recheck your heart rate and perform an EKG. Please deliver the EKG results to your cardiologist for an earlier evaluation.  INSTRUCTIONS:  Please follow up with your cardiologist as soon as possible and deliver the EKG results to them for an earlier evaluation. Continue taking your medications as prescribed and use your back brace regularly. If you experience any new symptoms or if your pain worsens, please contact our office.

## 2023-09-15 ENCOUNTER — Ambulatory Visit: Attending: Cardiology | Admitting: Cardiology

## 2023-09-15 ENCOUNTER — Ambulatory Visit: Attending: Cardiology

## 2023-09-15 ENCOUNTER — Encounter: Payer: Self-pay | Admitting: Family Medicine

## 2023-09-15 VITALS — BP 98/50 | HR 54 | Ht 68.5 in | Wt 147.0 lb

## 2023-09-15 DIAGNOSIS — R0609 Other forms of dyspnea: Secondary | ICD-10-CM

## 2023-09-15 DIAGNOSIS — R001 Bradycardia, unspecified: Secondary | ICD-10-CM | POA: Diagnosis not present

## 2023-09-15 DIAGNOSIS — I10 Essential (primary) hypertension: Secondary | ICD-10-CM | POA: Diagnosis not present

## 2023-09-15 DIAGNOSIS — I440 Atrioventricular block, first degree: Secondary | ICD-10-CM | POA: Diagnosis not present

## 2023-09-15 DIAGNOSIS — E785 Hyperlipidemia, unspecified: Secondary | ICD-10-CM

## 2023-09-15 LAB — BASIC METABOLIC PANEL WITH GFR
BUN/Creatinine Ratio: 13 (ref 10–24)
BUN: 15 mg/dL (ref 8–27)
CO2: 22 mmol/L (ref 20–29)
Calcium: 9.4 mg/dL (ref 8.6–10.2)
Chloride: 101 mmol/L (ref 96–106)
Creatinine, Ser: 1.15 mg/dL (ref 0.76–1.27)
Glucose: 95 mg/dL (ref 70–99)
Potassium: 5 mmol/L (ref 3.5–5.2)
Sodium: 137 mmol/L (ref 134–144)
eGFR: 69 mL/min/{1.73_m2} (ref >59)

## 2023-09-15 NOTE — Patient Instructions (Addendum)
 Medication Instructions:  Your physician recommends that you continue on your current medications as directed. Please refer to the Current Medication list given to you today.  *If you need a refill on your cardiac medications before your next appointment, please call your pharmacy*   Lab Work: 3rd Floor   [address]  Your physician recommends that you return for lab work in:  when fasting You need to have labs done when you are fasting.  You can come Monday through Friday 8:00 am to 11:30AM and 1:00 to 4:00. You do not need to make an appointment as the order has already been placed.   Testing/Procedures: Your physician has requested that you have an echocardiogram. Echocardiography is a painless test that uses sound waves to create images of your heart. It provides your doctor with information about the size and shape of your heart and how well your heart's chambers and valves are working. This procedure takes approximately one hour. There are no restrictions for this procedure. Please do NOT wear cologne, perfume, aftershave, or lotions (deodorant is allowed). Please arrive 15 minutes prior to your appointment time.  Please note: We ask at that you not bring children with you during ultrasound (echo/ vascular) testing. Due to room size and safety concerns, children are not allowed in the ultrasound rooms during exams. Our front office staff cannot provide observation of children in our lobby area while testing is being conducted. An adult accompanying a patient to their appointment will only be allowed in the ultrasound room at the discretion of the ultrasound technician under special circumstances. We apologize for any inconvenience.    WHY IS MY DOCTOR PRESCRIBING ZIO? The Zio system is proven and trusted by physicians to detect and diagnose irregular heart rhythms -- and has been prescribed to hundreds of thousands of patients.  The FDA has cleared the Zio system to monitor for many  different kinds of irregular heart rhythms. In a study, physicians were able to reach a diagnosis 90% of the time with the Zio system1.  You can wear the Zio monitor -- a small, discreet, comfortable patch -- during your normal day-to-day activity, including while you sleep, shower, and exercise, while it records every single heartbeat for analysis.  1Barrett, P., et al. Comparison of 24 Hour Holter Monitoring Versus 14 Day Novel Adhesive Patch Electrocardiographic Monitoring. American Journal of Medicine, 2014.  ZIO VS. HOLTER MONITORING The Zio monitor can be comfortably worn for up to 14 days. Holter monitors can be worn for 24 to 48 hours, limiting the time to record any irregular heart rhythms you may have. Zio is able to capture data for the 51% of patients who have their first symptom-triggered arrhythmia after 48 hours.1  LIVE WITHOUT RESTRICTIONS The Zio ambulatory cardiac monitor is a small, unobtrusive, and water-resistant patch--you might even forget you're wearing it. The Zio monitor records and stores every beat of your heart, whether you're sleeping, working out, or showering.     Follow-Up: At [facility], you and your health needs are our priority.  As part of our continuing mission to provide you with exceptional heart care, we have created designated Provider Care Teams.  These Care Teams include your primary Cardiologist (physician) and Advanced Practice Providers (APPs -  Physician Assistants and Nurse Practitioners) who all work together to provide you with the care you need, when you need it.  We recommend signing up for the patient portal called "MyChart".  Sign up information is provided on this  After Visit Summary.  MyChart is used to connect with patients for Virtual Visits (Telemedicine).  Patients are able to view lab/test results, encounter notes, upcoming appointments, etc.  Non-urgent messages can be sent to your provider as well.   To learn more about what you can  do with MyChart, go to [email or web address].    Your next appointment:   12 month(s)  The format for your next appointment:   In Person  Provider:   Gypsy Balsam, MD    Other Instructions NA

## 2023-09-15 NOTE — Progress Notes (Unsigned)
 Cardiology Office Note:    Date:  09/15/2023   ID:  Isaac Chan, DOB 05-Dec-1955, MRN [id number]  PCP:  Hoy Register, MD  Cardiologist:  Gypsy Balsam, MD    Referring MD: Hoy Register, MD   Chief Complaint  Patient presents with   Follow-up    History of Present Illness:    Isaac Chan is a 68 y.o. male past medical history significant for essential hypertension, first-degree AV block, bradycardia, dyspnea on exertion, smoking clamps marks only 5 cigarettes a day.  Comes today 2 months of follow-up overall he states doing fine problems of back and leg pain.  Denies of any chest pain tightness squeezing pressure burning chest.  No dizziness no passing out.  Comes today to my office due to concern of bradycardia apparently yesterday had EKG done which showed heart rate 45 again he is completely asymptomatic  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   Dizziness 05/23/2020   Dyspnea on exertion 05/23/2020   Essential hypertension 05/23/2020   Hyperlipidemia    Shoulder injury, left, initial encounter 07/02/2017   Smoking 05/23/2020    Past Surgical History:  Procedure Laterality Date   COLONOSCOPY     R wrist fx repaired     SHOULDER ARTHROSCOPY Left     Current Medications: Current Meds  Medication Sig   amLODipine (NORVASC) 10 MG tablet Take 1 tablet (10 mg total) by mouth daily. TAKE 1 TABLET(10 MG) BY MOUTH DAILY   atorvastatin (LIPITOR) 40 MG tablet Take 1 tablet (40 mg total) by mouth daily.   gabapentin (NEURONTIN) 300 MG capsule Take 1 capsule (300 mg total) by mouth 3 (three) times daily.   losartan-hydrochlorothiazide (HYZAAR) 100-25 MG tablet Take 1 tablet by mouth daily.   spironolactone (ALDACTONE) 25 MG tablet Take 1 tablet (25 mg total) by mouth daily.   tiZANidine (ZANAFLEX) 4 MG tablet Take 1 tablet (4 mg total) by mouth every 8 (eight) hours as needed for muscle spasms.     Allergies:   Patient has no known allergies.    Social History   Socioeconomic History   Marital status: Married    Spouse name: Not on file   Number of children: Not on file   Years of education: Not on file   Highest education level: Not on file  Occupational History   Not on file  Tobacco Use   Smoking status: Former    Current packs/day: 0.00    Average packs/day: 0.3 packs/day for 46.0 years (11.5 ttl pk-yrs)    Types: Cigarettes    Start date: 07/10/1975    Quit date: 07/09/2021    Years since quitting: 2.1   Smokeless tobacco: Never  Vaping Use   Vaping status: Never Used  Substance and Sexual Activity   Alcohol use: Yes    Comment: once day-beer   Drug use: Yes    Types: Marijuana    Comment: daily, last was 10/02/21 this week   Sexual activity: Yes  Other Topics Concern   Not on file  Social History Narrative   Not on file   Social Drivers of Health   Financial Resource Strain: Low Risk  (07/14/2023)   Overall Financial Resource Strain (CARDIA)    Difficulty of Paying Living Expenses: Not hard at all  Food Insecurity: No Food Insecurity (07/14/2023)   Hunger Vital Sign    Worried About Running Out of Food in the Last Year: Never true    Ran Out of Food  in the Last Year: Never true  Transportation Needs: No Transportation Needs (07/14/2023)   PRAPARE - Administrator, Civil Service (Medical): No    Lack of Transportation (Non-Medical): No  Physical Activity: Inactive (07/14/2023)   Exercise Vital Sign    Days of Exercise per Week: 0 days    Minutes of Exercise per Session: 0 min  Stress: No Stress Concern Present (07/14/2023)   [employer] of Occupational Health - Occupational Stress Questionnaire    Feeling of Stress : Not at all  Social Connections: Socially Integrated (07/14/2023)   Social Connection and Isolation Panel [NHANES]    Frequency of Communication with Friends and Family: More than three times a week    Frequency of Social Gatherings with Friends and Family: More than three times  a week    Attends Religious Services: More than 4 times per year    Active Member of [employer] or Organizations: Yes    Attends Engineer, structural: More than 4 times per year    Marital Status: Married     Family History: The patient's family history includes Prostate cancer in his father. There is no history of Colon cancer, Colon polyps, Esophageal cancer, Stomach cancer, or Rectal cancer. ROS:   Please see the history of present illness.    All 14 point review of systems negative except as described per history of present illness  EKGs/Labs/Other Studies Reviewed:    EKG Interpretation Date/Time:  Tuesday September 15 2023 08:08:45 EDT Ventricular Rate:  54 PR Interval:  216 QRS Duration:  102 QT Interval:  454 QTC Calculation: 430 R Axis:   11  Text Interpretation: Sinus bradycardia with 1st degree A-V block with Premature atrial complexes with Abberant conduction Low voltage QRS When compared with ECG of 17-May-2020 07:57, PREVIOUS ECG IS PRESENT Confirmed by Gypsy Balsam [phone]) on 09/15/2023 8:12:19 AM    Recent Labs: 10/28/2022: ALT 21; Hemoglobin 11.5; Platelets 219 09/14/2023: BUN 15; Creatinine, Ser 1.15; Potassium 5.0; Sodium 137  Recent Lipid Panel    Component Value Date/Time   CHOL 175 10/16/2021 1356   TRIG 59 10/16/2021 1356   HDL 99 10/16/2021 1356   CHOLHDL 2.8 06/21/2020 0850   LDLCALC 64 10/16/2021 1356    Physical Exam:    VS:  BP (!) 98/50 (BP Location: Right Arm, Patient Position: Sitting)   Pulse (!) 54   Ht 5' 8.5" (1.74 m)   Wt 147 lb (66.7 kg)   SpO2 100%   BMI 22.03 kg/m     Wt Readings from Last 3 Encounters:  09/15/23 147 lb (66.7 kg)  09/14/23 148 lb 9.6 oz (67.4 kg)  07/16/23 148 lb (67.1 kg)     GEN:  Well nourished, well developed in no acute distress HEENT: Normal NECK: No JVD; No carotid bruits LYMPHATICS: No lymphadenopathy CARDIAC: RRR, no murmurs, no rubs, no gallops RESPIRATORY:  Clear to auscultation without  rales, wheezing or rhonchi  ABDOMEN: Soft, non-tender, non-distended MUSCULOSKELETAL:  No edema; No deformity  SKIN: Warm and dry LOWER EXTREMITIES: no swelling NEUROLOGIC:  Alert and oriented x 3 PSYCHIATRIC:  Normal affect   ASSESSMENT:    1. Essential hypertension   2. First degree AV block   3. Bradycardia   4. Dyslipidemia   5. Dyspnea on exertion    PLAN:    In order of problems listed above:  Essential hypertension: Blood pressure actually is low today I asked him to check blood pressure in the leg  and (let me know how it is.  He is feeling good today. First-degree AV block with sinus bradycardia.  He is asymptomatic no dizziness no passing out.  I will ask him however to wear Zio patch to make sure there is no more advanced conducting problem however if its sinus node issue and he is completely asymptomatic the recommendation will be just simply monitoring. Dyslipidemia I did review his K PN which show me LDL 64 HDL 99.  He is taking statin in form of Lipitor 40 which I will continue.  I will recheck his fasting lipid profile he is asking about potentially checking for blockages so far I have no indication for it.  He may in the future required calcium score just to have understanding where he stented with atherosclerosis.  But overall risk factor seems to be very well modified.  Of course smoking is a problem. Smoking still continue I had a long discussion with him and strongly recommended to quit he understands hopefully he will be able to comply   Medication Adjustments/Labs and Tests Ordered: Current medicines are reviewed at length with the patient today.  Concerns regarding medicines are outlined above.  Orders Placed This Encounter  Procedures   EKG 12-Lead   Medication changes: No orders of the defined types were placed in this encounter.   Signed, Georgeanna Lea, MD, [facility] 09/15/2023 8:26 AM    [facility]

## 2023-09-16 LAB — LIPID PANEL
Chol/HDL Ratio: 1.6 ratio (ref 0.0–5.0)
Cholesterol, Total: 163 mg/dL (ref 100–199)
HDL: 101 mg/dL (ref >39)
LDL Chol Calc (NIH): 51 mg/dL (ref 0–99)
Triglycerides: 55 mg/dL (ref 0–149)
VLDL Cholesterol Cal: 11 mg/dL (ref 5–40)

## 2023-09-22 ENCOUNTER — Telehealth: Payer: Self-pay

## 2023-09-22 NOTE — Telephone Encounter (Signed)
 Patient notified through my chart.

## 2023-09-22 NOTE — Telephone Encounter (Signed)
-----   Message from Ralene Burger sent at 09/22/2023  8:31 AM EDT ----- Cholesterol perfect, continue present management

## 2023-09-30 ENCOUNTER — Ambulatory Visit: Admitting: Cardiology

## 2023-10-03 DIAGNOSIS — R001 Bradycardia, unspecified: Secondary | ICD-10-CM | POA: Diagnosis not present

## 2023-10-06 ENCOUNTER — Ambulatory Visit (HOSPITAL_BASED_OUTPATIENT_CLINIC_OR_DEPARTMENT_OTHER)
Admission: RE | Admit: 2023-10-06 | Discharge: 2023-10-06 | Disposition: A | Discharge status: Home / Self Care | Source: Ambulatory Visit | Attending: Family Medicine | Admitting: Family Medicine

## 2023-10-06 DIAGNOSIS — R0609 Other forms of dyspnea: Secondary | ICD-10-CM | POA: Insufficient documentation

## 2023-10-06 LAB — ECHOCARDIOGRAM COMPLETE
AR max vel: 2.88 cm2
AV Area VTI: 2.88 cm2
AV Area mean vel: 2.88 cm2
AV Mean grad: 3 mmHg
AV Peak grad: 5.5 mmHg
Ao pk vel: 1.17 m/s
Area-P 1/2: 4.8 cm2
Calc EF: 72.5 %
MV M vel: 5.35 m/s
MV Peak grad: 114.3 mmHg
MV Vena cont: 0.2 cm
S' Lateral: 2.2 cm
Single Plane A2C EF: 71.9 %
Single Plane A4C EF: 73 %

## 2023-10-09 ENCOUNTER — Telehealth: Payer: Self-pay

## 2023-10-09 NOTE — Telephone Encounter (Signed)
 Left message on My Chart with Echo results per Dr. Vanetta Shawl note. Routed to PCP.

## 2023-10-13 ENCOUNTER — Telehealth: Payer: Self-pay

## 2023-10-13 NOTE — Telephone Encounter (Signed)
 Patient notified of results and verbalized understanding.

## 2023-10-13 NOTE — Telephone Encounter (Signed)
-----   Message from Ralene Burger sent at 10/09/2023  9:40 AM EDT ----- Echocardiogram showed normal left ventricle ejection fraction, mild mitral valve regurgitation overall looks good

## 2023-10-14 ENCOUNTER — Other Ambulatory Visit (HOSPITAL_BASED_OUTPATIENT_CLINIC_OR_DEPARTMENT_OTHER): Payer: Self-pay | Admitting: Pharmacist

## 2023-10-14 ENCOUNTER — Encounter: Payer: Self-pay | Admitting: Pharmacist

## 2023-10-14 DIAGNOSIS — I10 Essential (primary) hypertension: Secondary | ICD-10-CM

## 2023-10-14 NOTE — Progress Notes (Signed)
 Pharmacy Quality Measure Review  This patient is appearing on a report for being at risk of failing the adherence measure for hypertension (ACEi/ARB) medications this calendar year.   Medication: losartan -HCTZ Last fill date: 07/17/2023 for 90 day supply  Contacted pharmacy to facilitate refills.  Marene Shape, PharmD, Becky Bowels, CPP Clinical Pharmacist [facility] & [facility] [phone]

## 2023-10-16 ENCOUNTER — Other Ambulatory Visit: Payer: Self-pay

## 2023-10-16 MED ORDER — SPIRONOLACTONE 25 MG PO TABS: 25.0000 mg | ORAL_TABLET | Freq: Every day | ORAL | 2 refills | Status: AC

## 2023-10-19 DIAGNOSIS — R001 Bradycardia, unspecified: Secondary | ICD-10-CM

## 2023-10-21 ENCOUNTER — Other Ambulatory Visit (HOSPITAL_BASED_OUTPATIENT_CLINIC_OR_DEPARTMENT_OTHER): Admitting: Pharmacist

## 2023-10-21 ENCOUNTER — Encounter: Payer: Self-pay | Admitting: Pharmacist

## 2023-10-21 DIAGNOSIS — I10 Essential (primary) hypertension: Secondary | ICD-10-CM

## 2023-10-21 NOTE — Progress Notes (Signed)
 Pharmacy Quality Measure Review  This patient is appearing on a report for being at risk of failing the adherence measure for hypertension (ACEi/ARB) medications this calendar year.   Medication: losartan -HCTZ Last fill date: 10/19/2023 for 90 day supply. Reminder set in 3 months for recheck.   Marene Shape, PharmD, Becky Bowels, CPP Clinical Pharmacist [facility] & [facility] [phone]

## 2023-10-28 ENCOUNTER — Ambulatory Visit: Payer: Self-pay | Admitting: Cardiology

## 2023-11-13 ENCOUNTER — Ambulatory Visit: Payer: PPO | Admitting: Urology

## 2023-11-13 ENCOUNTER — Encounter: Payer: Self-pay | Admitting: Urology

## 2023-11-13 VITALS — BP 114/67 | HR 50 | Ht 69.0 in | Wt 145.0 lb

## 2023-11-13 DIAGNOSIS — N401 Enlarged prostate with lower urinary tract symptoms: Secondary | ICD-10-CM | POA: Diagnosis not present

## 2023-11-13 DIAGNOSIS — R310 Gross hematuria: Secondary | ICD-10-CM | POA: Diagnosis not present

## 2023-11-13 DIAGNOSIS — R972 Elevated prostate specific antigen [PSA]: Secondary | ICD-10-CM | POA: Diagnosis not present

## 2023-11-13 DIAGNOSIS — N138 Other obstructive and reflux uropathy: Secondary | ICD-10-CM | POA: Diagnosis not present

## 2023-11-13 DIAGNOSIS — Z8042 Family history of malignant neoplasm of prostate: Secondary | ICD-10-CM | POA: Diagnosis not present

## 2023-11-13 DIAGNOSIS — N281 Cyst of kidney, acquired: Secondary | ICD-10-CM | POA: Diagnosis not present

## 2023-11-13 LAB — URINALYSIS, ROUTINE W REFLEX MICROSCOPIC
Bilirubin, UA: NEGATIVE
Glucose, UA: NEGATIVE
Ketones, UA: NEGATIVE
Leukocytes,UA: NEGATIVE
Nitrite, UA: NEGATIVE
Protein,UA: NEGATIVE
RBC, UA: NEGATIVE
Specific Gravity, UA: 1.015 (ref 1.005–1.030)
Urobilinogen, Ur: 0.2 mg/dL (ref 0.2–1.0)
pH, UA: 7 (ref 5.0–7.5)

## 2023-11-13 NOTE — Progress Notes (Signed)
 Assessment: 1. Gross hematuria - negative evaluation 2/25   2. Elevated PSA; negative biopsy 6/24   3. Family history of prostate cancer in father   4. BPH with obstruction/lower urinary tract symptoms   5. Renal cyst; left; Bosniak 1     Plan: The patient's evaluation for gross hematuria was negative for any serious or life-threatening causes. PSA today Return to office in 6 months  Chief Complaint:  Chief Complaint  Patient presents with   Hematuria    History of Present Illness:  Isaac Chan is a 68 y.o. male who is seen for continued evaluation of gross hematuria.  Gross hematuria: He had an episode of gross hematuria approximately 3 weeks ago.  The hematuria lasted for approximately 4 days and was intermittent.  He noted dark red urine without clots.  No associated symptoms including dysuria or flank pain.  The hematuria resolved spontaneously.  He has had no further episodes. He does have a prior history of tobacco use. IPSS = 9. Urinalysis showed no evidence of RBCs. CT hematuria protocol from 06/26/2023 showed no solid renal masses, simple appearing left renal cyst, no renal or ureteral calculi, no evidence of obstruction or filling defect. Cystoscopy from 07/16/2023 showed lateral lobe enlargement of the prostate with a small median lobe, no mucosal abnormalities.  Urine cytology was negative for malignancy.  He returns today for scheduled follow-up.  No further episodes of gross hematuria.  No dysuria or flank pain.  His lower urinary tract symptoms remain very stable. IPSS = 7/1.  Elevated PSA: He was found to have an elevated PSA. PSA results: 5/23 1.4 5/24 5.2 with 17.3% free  No prior history of elevated PSAs.  No history of UTIs or prostatitis. He does have a family history of prostate cancer with his father. He has lower urinary tract symptoms including frequency, urgency, nocturia x 2 and an intermittent stream.  No dysuria or gross hematuria. IPSS =  15.  He underwent a transrectal ultrasound and biopsy of the prostate on 11/26/22. TRUS volume:  34.7 ml  PSA density:  0.15 Biopsy results:             Benign prostate tissue, PIN left mid gland  Complications after biopsy: none  PSA 12/24:  2.3  BPH with LUTS: At his visit in 12/24, he reported some frequency, urgency, and nocturia.  No dysuria or gross hematuria. IPSS = 8.  Portions of the above documentation were copied from a prior visit for review purposes only.  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   Dizziness 05/23/2020   Dyspnea on exertion 05/23/2020   Essential hypertension 05/23/2020   Hyperlipidemia    Shoulder injury, left, initial encounter 07/02/2017   Smoking 05/23/2020    Past Surgical History:  Past Surgical History:  Procedure Laterality Date   COLONOSCOPY     R wrist fx repaired     SHOULDER ARTHROSCOPY Left     Allergies:  No Known Allergies  Family History:  Family History  Problem Relation Age of Onset   Prostate cancer Father    Colon cancer Neg Hx    Colon polyps Neg Hx    Esophageal cancer Neg Hx    Stomach cancer Neg Hx    Rectal cancer Neg Hx     Social History:  Social History   Tobacco Use   Smoking status: Former    Current packs/day: 0.00    Average packs/day: 0.3  packs/day for 46.0 years (11.5 ttl pk-yrs)    Types: Cigarettes    Start date: 07/10/1975    Quit date: 07/09/2021    Years since quitting: 2.3   Smokeless tobacco: Never  Vaping Use   Vaping status: Never Used  Substance Use Topics   Alcohol use: Yes    Comment: once day-beer   Drug use: Yes    Types: Marijuana    Comment: daily, last was 10/02/21 this week    ROS: Constitutional:  Negative for fever, chills, weight loss CV: Negative for chest pain, previous MI, hypertension Respiratory:  Negative for shortness of breath, wheezing, sleep apnea, frequent cough GI:  Negative for nausea, vomiting, bloody  stool, GERD  Physical exam: BP 114/67   Pulse (!) 50   Ht 5\' 9"  (1.753 m)   Wt 145 lb (65.8 kg)   BMI 21.41 kg/m  GENERAL APPEARANCE:  Well appearing, well developed, well nourished, NAD HEENT:  Atraumatic, normocephalic, oropharynx clear NECK:  Supple without lymphadenopathy or thyromegaly ABDOMEN:  Soft, non-tender, no masses EXTREMITIES:  Moves all extremities well, without clubbing, cyanosis, or edema NEUROLOGIC:  Alert and oriented x 3, normal gait, CN II-XII grossly intact MENTAL STATUS:  appropriate BACK:  Non-tender to palpation, No CVAT SKIN:  Warm, dry, and intact   Results: U/A: negative

## 2023-11-14 LAB — PSA: Prostate Specific Ag, Serum: 3.1 ng/mL (ref 0.0–4.0)

## 2023-11-16 ENCOUNTER — Ambulatory Visit: Payer: Self-pay | Admitting: Urology

## 2023-11-16 ENCOUNTER — Ambulatory Visit: Payer: PPO | Admitting: Urology

## 2024-01-13 ENCOUNTER — Encounter: Payer: Self-pay | Admitting: Pharmacist

## 2024-01-13 ENCOUNTER — Other Ambulatory Visit (HOSPITAL_BASED_OUTPATIENT_CLINIC_OR_DEPARTMENT_OTHER): Payer: Self-pay | Admitting: Pharmacist

## 2024-01-13 DIAGNOSIS — I10 Essential (primary) hypertension: Secondary | ICD-10-CM

## 2024-01-13 DIAGNOSIS — E785 Hyperlipidemia, unspecified: Secondary | ICD-10-CM

## 2024-01-13 NOTE — Progress Notes (Signed)
 Pharmacy Quality Measure Review  This patient is appearing on a report for being at risk of failing the adherence measure for antihypertensive and antihyperlipidemic medications this calendar year.   Medication: losartan -HCTZ Last fill date: 10/19/2023 for 90 day supply. Reminder set in 3 months for recheck.   Medication: atorvastatin   Last fill date: 10/19/2023 for 90 day supply. Reminder set in 3 months for recheck.  Called his pharmacy. He is scheduled to be filled on 01/15/2024.   Herlene Fleeta Morris, PharmD, JAQUELINE, CPP Clinical Pharmacist [facility] & [facility] [phone]

## 2024-01-20 ENCOUNTER — Other Ambulatory Visit: Payer: Self-pay | Admitting: Pharmacist

## 2024-01-20 NOTE — Progress Notes (Signed)
 Pharmacy Quality Measure Review  This patient is appearing on a report for being at risk of failing the adherence measure for antihypertensive and antihyperlipidemic medications this calendar year.   Medication: losartan -HCTZ Last fill date: 10/19/2023 for 90 day supply. Reminder set in 3 months for recheck.   Medication: atorvastatin   Last fill date: 10/19/2023 for 90 day supply. Reminder set in 3 months for recheck.  Called his pharmacy. These were filled on 01/15/2024 but patient has not picked up. Left HIPAA-compliant VM with instructions to pick these up from his pharmacy.    Herlene Fleeta Morris, PharmD, JAQUELINE, CPP Clinical Pharmacist [facility] & [facility] [phone]

## 2024-01-20 NOTE — Progress Notes (Signed)
 Pharmacy Quality Measure Review  This patient is appearing on a report for being at risk of failing the adherence measure for antihypertensive and antihyperlipidemic medications this calendar year.   Medication: losartan -HCTZ Last fill date: 10/19/2023 for 90 day supply. Reminder set in 3 months for recheck.   Medication: atorvastatin   Last fill date: 10/19/2023 for 90 day supply. Reminder set in 3 months for recheck.  Called his pharmacy. These were filled on 01/15/2024 but patient has not picked up. Left HIPAA-compliant VM with instructions to pick these up from his pharmacy.    Herlene Fleeta Morris, PharmD, JAQUELINE, CPP Clinical Pharmacist [facility] & [facility] [phone]

## 2024-01-28 ENCOUNTER — Other Ambulatory Visit: Payer: Self-pay | Admitting: Pharmacist

## 2024-01-28 NOTE — Progress Notes (Signed)
 Pharmacy Quality Measure Review  This patient is appearing on a report for being at risk of failing the adherence measure for antihypertensive and antihyperlipidemic medications this calendar year.   Medication: losartan -HCTZ Last fill date: 10/19/2023 for 90 day supply.    Medication: atorvastatin   Last fill date: 10/19/2023 for 90 day supply.   Called his pharmacy. These were filled on 01/15/2024 but patient did not pick up and they were returned. I left a VM on 8/6 and 01/20/24 with instructions to pick these up.   I called the pharmacy again today and had them get his atorvastatin  and losartan -hydrochlorothiazide  ready. I called the patient but again had to leave a VM instructing him to pick these up.   Herlene Fleeta Morris, PharmD, JAQUELINE, CPP Clinical Pharmacist [facility] [phone]

## 2024-03-14 ENCOUNTER — Telehealth: Payer: Self-pay | Admitting: Family Medicine

## 2024-03-14 NOTE — Telephone Encounter (Signed)
 1st attempt contacted pt resch appt

## 2024-03-15 ENCOUNTER — Ambulatory Visit: Admitting: Family Medicine

## 2024-03-29 ENCOUNTER — Other Ambulatory Visit: Payer: Self-pay | Admitting: Family Medicine

## 2024-03-29 ENCOUNTER — Ambulatory Visit: Attending: Family Medicine | Admitting: Family Medicine

## 2024-03-29 ENCOUNTER — Encounter: Payer: Self-pay | Admitting: Family Medicine

## 2024-03-29 VITALS — BP 102/35 | HR 51 | Temp 98.4°F | Ht 69.0 in | Wt 146.4 lb

## 2024-03-29 DIAGNOSIS — J328 Other chronic sinusitis: Secondary | ICD-10-CM | POA: Diagnosis not present

## 2024-03-29 DIAGNOSIS — G8929 Other chronic pain: Secondary | ICD-10-CM

## 2024-03-29 DIAGNOSIS — E7800 Pure hypercholesterolemia, unspecified: Secondary | ICD-10-CM

## 2024-03-29 DIAGNOSIS — M5442 Lumbago with sciatica, left side: Secondary | ICD-10-CM

## 2024-03-29 DIAGNOSIS — R001 Bradycardia, unspecified: Secondary | ICD-10-CM

## 2024-03-29 DIAGNOSIS — M5441 Lumbago with sciatica, right side: Secondary | ICD-10-CM

## 2024-03-29 DIAGNOSIS — I10 Essential (primary) hypertension: Secondary | ICD-10-CM

## 2024-03-29 MED ORDER — AMLODIPINE BESYLATE 5 MG PO TABS: 5.0000 mg | ORAL_TABLET | Freq: Every day | ORAL | 1 refills | Status: AC

## 2024-03-29 MED ORDER — LOSARTAN POTASSIUM-HCTZ 100-25 MG PO TABS: 1.0000 | ORAL_TABLET | Freq: Every day | ORAL | 1 refills | Status: AC

## 2024-03-29 MED ORDER — CETIRIZINE HCL 10 MG PO TABS: 10.0000 mg | ORAL_TABLET | Freq: Every day | ORAL | 1 refills | Status: AC

## 2024-03-29 MED ORDER — GABAPENTIN 300 MG PO CAPS: 300.0000 mg | ORAL_CAPSULE | Freq: Three times a day (TID) | ORAL | 1 refills | Status: AC

## 2024-03-29 MED ORDER — TIZANIDINE HCL 4 MG PO TABS: 4.0000 mg | ORAL_TABLET | Freq: Three times a day (TID) | ORAL | 2 refills | Status: AC | PRN

## 2024-03-29 MED ORDER — ATORVASTATIN CALCIUM 40 MG PO TABS: 40.0000 mg | ORAL_TABLET | Freq: Every day | ORAL | 1 refills | Status: AC

## 2024-03-29 MED ORDER — FLUTICASONE PROPIONATE 50 MCG/ACT NA SUSP
2.0000 | Freq: Every day | NASAL | 1 refills | Status: DC
Start: 2024-03-29 — End: 2024-03-30

## 2024-03-29 NOTE — Progress Notes (Signed)
 Subjective:  Patient ID: Isaac Chan, male    DOB: Jul 01, 1955  Age: 68 y.o. MRN: [id number]  CC: Medical Management of Chronic Issues     Discussed the use of AI scribe software for clinical note transcription with the patient, who gave verbal consent to proceed.  History of Present Illness Isaac Chan is a 68 year old male with a history of nicotine  dependence, hypertension, hyperlipidemia (6 cigarettes on and off per day x 30 years), THC use who presents for follow-up of chronic medical conditions.  He was referred to cardiology due to bradycardia and finding of first-degree AV block on EKG.  Workup included echocardiogram which revealed normal EF and he also had a Zio patch placed with recommendations by cardiology to continue to monitor.  Initial heart rate on intake was 33. He does not experience faintness or lightheadedness despite the low heart rate. He is on spironolactone , amlodipine , and losartan /hydrochlorothiazide  for blood pressure management..  Spironolactone  was added at cardiology visit.  He has chronic mucus production since childhood, with white mucus most bothersome in the morning, requiring coughing and nose blowing. Mucus is not green or clear. Occasional wheezing occurs, and he feels mucus draining down the throat at night. No fever, cheekbone or forehead pain, ear clogging, headaches, dizziness, shortness of breath, or chest pain. Benadryl provides some relief.  He experiences tingling in the right leg, particularly in the morning, which dissipates after a few hours. Prolonged sitting exacerbates the tingling. Tingling starts from the upper leg and radiates downward. He has a history of sciatica and has been prescribed muscle relaxants.  He wears a back brace due to chronic low back pain.    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   Dizziness 05/23/2020   Dyspnea on exertion 05/23/2020   Essential hypertension 05/23/2020   Hyperlipidemia     Shoulder injury, left, initial encounter 07/02/2017   Smoking 05/23/2020    Past Surgical History:  Procedure Laterality Date   COLONOSCOPY     R wrist fx repaired     SHOULDER ARTHROSCOPY Left     Family History  Problem Relation Age of Onset   Prostate cancer Father    Colon cancer Neg Hx    Colon polyps Neg Hx    Esophageal cancer Neg Hx    Stomach cancer Neg Hx    Rectal cancer Neg Hx     Social History   Socioeconomic History   Marital status: Married    Spouse name: Not on file   Number of children: Not on file   Years of education: Not on file   Highest education level: Not on file  Occupational History   Not on file  Tobacco Use   Smoking status: Former    Current packs/day: 0.00    Average packs/day: 0.3 packs/day for 46.0 years (11.5 ttl pk-yrs)    Types: Cigarettes    Start date: 07/10/1975    Quit date: 07/09/2021    Years since quitting: 2.7   Smokeless tobacco: Never  Vaping Use   Vaping status: Never Used  Substance and Sexual Activity   Alcohol use: Yes    Comment: once day-beer   Drug use: Yes    Types: Marijuana    Comment: daily, last was 10/02/21 this week   Sexual activity: Yes  Other Topics Concern   Not on file  Social History Narrative   Not on file   Social Drivers of Corporate investment banker  Strain: Low Risk  (07/14/2023)   Overall Financial Resource Strain (CARDIA)    Difficulty of Paying Living Expenses: Not hard at all  Food Insecurity: No Food Insecurity (07/14/2023)   Hunger Vital Sign    Worried About Running Out of Food in the Last Year: Never true    Ran Out of Food in the Last Year: Never true  Transportation Needs: No Transportation Needs (07/14/2023)   PRAPARE - Administrator, Civil Service (Medical): No    Lack of Transportation (Non-Medical): No  Physical Activity: Inactive (07/14/2023)   Exercise Vital Sign    Days of Exercise per Week: 0 days    Minutes of Exercise per Session: 0 min  Stress: No  Stress Concern Present (07/14/2023)   [employer] of Occupational Health - Occupational Stress Questionnaire    Feeling of Stress : Not at all  Social Connections: Socially Integrated (07/14/2023)   Social Connection and Isolation Panel    Frequency of Communication with Friends and Family: More than three times a week    Frequency of Social Gatherings with Friends and Family: More than three times a week    Attends Religious Services: More than 4 times per year    Active Member of [employer] or Organizations: Yes    Attends Engineer, structural: More than 4 times per year    Marital Status: Married    No Known Allergies  Outpatient Medications Prior to Visit  Medication Sig Dispense Refill   spironolactone  (ALDACTONE ) 25 MG tablet Take 1 tablet (25 mg total) by mouth daily. 90 tablet 2   amLODipine  (NORVASC ) 10 MG tablet Take 1 tablet (10 mg total) by mouth daily. TAKE 1 TABLET(10 MG) BY MOUTH DAILY 90 tablet 2   atorvastatin  (LIPITOR) 40 MG tablet Take 1 tablet (40 mg total) by mouth daily. 90 tablet 1   gabapentin  (NEURONTIN ) 300 MG capsule Take 1 capsule (300 mg total) by mouth 3 (three) times daily. 270 capsule 1   losartan -hydrochlorothiazide  (HYZAAR) 100-25 MG tablet Take 1 tablet by mouth daily. 90 tablet 1   tiZANidine  (ZANAFLEX ) 4 MG tablet Take 1 tablet (4 mg total) by mouth every 8 (eight) hours as needed for muscle spasms. 60 tablet 2   No facility-administered medications prior to visit.     ROS Review of Systems  Constitutional:  Negative for activity change and appetite change.  HENT:  Positive for congestion. Negative for sinus pressure and sore throat.   Respiratory:  Negative for chest tightness, shortness of breath and wheezing.   Cardiovascular:  Negative for chest pain and palpitations.  Gastrointestinal:  Negative for abdominal distention, abdominal pain and constipation.  Genitourinary: Negative.   Musculoskeletal:        See HPI   Psychiatric/Behavioral:  Negative for behavioral problems and dysphoric mood.    Objective:  BP (!) 102/35   Pulse (!) 51   Temp 98.4 F (36.9 C) (Oral)   Ht 5' 9 (1.753 m)   Wt 146 lb 6.4 oz (66.4 kg)   SpO2 99%   BMI 21.62 kg/m      03/29/2024    2:18 PM 11/13/2023    9:00 AM 09/15/2023    8:05 AM  BP/Weight  Systolic BP 102 114 98  Diastolic BP 35 67 50  Wt. (Lbs) 146.4 145 147  BMI 21.62 kg/m2 21.41 kg/m2 22.03 kg/m2      Physical Exam Constitutional:      Appearance: He is well-developed.  HENT:  Right Ear: Tympanic membrane normal.     Left Ear: Tympanic membrane normal.     Nose: Nose normal.  Cardiovascular:     Rate and Rhythm: Bradycardia present.     Heart sounds: Normal heart sounds. No murmur heard. Pulmonary:     Effort: Pulmonary effort is normal.     Breath sounds: Normal breath sounds. No wheezing or rales.  Chest:     Chest wall: No tenderness.  Abdominal:     General: Bowel sounds are normal. There is no distension.     Palpations: Abdomen is soft. There is no mass.     Tenderness: There is no abdominal tenderness.  Musculoskeletal:     Right lower leg: No edema.     Left lower leg: No edema.     Comments: Positive straight leg raise on the right  Neurological:     Mental Status: He is alert and oriented to person, place, and time.  Psychiatric:        Mood and Affect: Mood normal.        Latest Ref Rng & Units 09/14/2023    2:53 PM 06/24/2023   10:17 AM 10/28/2022    4:23 PM  CMP  Glucose 70 - 99 mg/dL 95  899  82   BUN 8 - 27 mg/dL 15  20  22    Creatinine 0.76 - 1.27 mg/dL 8.84  8.79  8.81   Sodium 134 - 144 mmol/L 137  138  135   Potassium 3.5 - 5.2 mmol/L 5.0  4.7  4.4   Chloride 96 - 106 mmol/L 101  103  99   CO2 20 - 29 mmol/L 22  19  22    Calcium  8.6 - 10.2 mg/dL 9.4  9.5  9.2   Total Protein 6.0 - 8.5 g/dL   7.0   Total Bilirubin 0.0 - 1.2 mg/dL   0.3   Alkaline Phos 44 - 121 IU/L   60   AST 0 - 40 IU/L   29   ALT 0 -  44 IU/L   21     Lipid Panel     Component Value Date/Time   CHOL 163 09/15/2023 0854   TRIG 55 09/15/2023 0854   HDL 101 09/15/2023 0854   CHOLHDL 1.6 09/15/2023 0854   LDLCALC 51 09/15/2023 0854    CBC    Component Value Date/Time   WBC 4.3 10/28/2022 1623   WBC 5.0 05/17/2020 0806   RBC 4.37 10/28/2022 1623   RBC 5.88 (H) 05/17/2020 0806   HGB 11.5 (L) 10/28/2022 1623   HCT 34.7 (L) 10/28/2022 1623   PLT 219 10/28/2022 1623   MCV 79 10/28/2022 1623   MCH 26.3 (L) 10/28/2022 1623   MCH 27.6 05/17/2020 0806   MCHC 33.1 10/28/2022 1623   MCHC 36.1 (H) 05/17/2020 0806   RDW 15.0 10/28/2022 1623   LYMPHSABS 1.6 10/28/2022 1623   MONOABS 0.5 05/17/2020 0806   EOSABS 0.1 10/28/2022 1623   BASOSABS 0.0 10/28/2022 1623    No results found for: HGBA1C     Assessment & Plan Bradycardia Heart rate initially at 33 but improved to 51 bpm upon repeat but remains concerning. Potential ischemia noted on EKG with new finding of ischemic changes in anteroseptal leads.  - Repeat heart rate measurement. - Notify cardiologist about low heart rate and per Dr. Bernie given he is asymptomatic he can schedule a sooner appointment with him - Order EKG to confirm heart rate. -  Advise ER visit if lightheadedness or chest pain occurs. - Schedule earlier cardiologist follow-up.  Essential hypertension Blood pressure low at 102/55 mmHg. Medication adjustment required to manage hypotension and avoid pill splitting. - Reduce amlodipine  to 5 mg daily. - Continue losartan -hydrochlorothiazide , spironolactone  as prescribed.  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 Managed with muscle relaxants. - Continue muscle relaxant prescription. - Educated on home exercises for sciatica. - Referral for physical therapy offered and declined.  Chronic sinus disease Chronic mucus production and occasional wheezing suggest chronic sinusitis.  No fever or sinus tenderness. - Placed on antihistamine and steroid nasal spray       Meds ordered this encounter  Medications   amLODipine  (NORVASC ) 5 MG tablet    Sig: Take 1 tablet (5 mg total) by mouth daily. TAKE 1 TABLET(10 MG) BY MOUTH DAILY    Dispense:  90 tablet    Refill:  1    Dose decrease   atorvastatin  (LIPITOR) 40 MG tablet    Sig: Take 1 tablet (40 mg total) by mouth daily.    Dispense:  90 tablet    Refill:  1   gabapentin  (NEURONTIN ) 300 MG capsule    Sig: Take 1 capsule (300 mg total) by mouth 3 (three) times daily.    Dispense:  270 capsule    Refill:  1   losartan -hydrochlorothiazide  (HYZAAR) 100-25 MG tablet    Sig: Take 1 tablet by mouth daily.    Dispense:  90 tablet    Refill:  1   tiZANidine  (ZANAFLEX ) 4 MG tablet    Sig: Take 1 tablet (4 mg total) by mouth every 8 (eight) hours as needed for muscle spasms.    Dispense:  60 tablet    Refill:  2   cetirizine (ZYRTEC) 10 MG tablet    Sig: Take 1 tablet (10 mg total) by mouth daily.    Dispense:  30 tablet    Refill:  1   fluticasone (FLONASE) 50 MCG/ACT nasal spray    Sig: Place 2 sprays into both nostrils daily.    Dispense:  16 g    Refill:  1    Follow-up: Return in about 6 months (around 09/27/2024) for Chronic medical conditions.       Corrina Sabin, MD, FAAFP. [facility] [place], [place] [phone]   03/29/2024, 5:59 PM

## 2024-03-30 ENCOUNTER — Ambulatory Visit: Payer: Self-pay | Admitting: Family Medicine

## 2024-03-30 LAB — CMP14+EGFR
ALT: 38 IU/L (ref 0–44)
AST: 39 IU/L (ref 0–40)
Albumin: 4.3 g/dL (ref 3.9–4.9)
Alkaline Phosphatase: 53 IU/L (ref 47–123)
BUN/Creatinine Ratio: 20 (ref 10–24)
BUN: 26 mg/dL (ref 8–27)
Bilirubin Total: 0.3 mg/dL (ref 0.0–1.2)
CO2: 24 mmol/L (ref 20–29)
Calcium: 9.3 mg/dL (ref 8.6–10.2)
Chloride: 102 mmol/L (ref 96–106)
Creatinine, Ser: 1.27 mg/dL (ref 0.76–1.27)
Globulin, Total: 2.6 g/dL (ref 1.5–4.5)
Glucose: 88 mg/dL (ref 70–99)
Potassium: 4.4 mmol/L (ref 3.5–5.2)
Sodium: 139 mmol/L (ref 134–144)
Total Protein: 6.9 g/dL (ref 6.0–8.5)
eGFR: 62 mL/min/1.73 (ref >59)

## 2024-05-16 ENCOUNTER — Ambulatory Visit: Admitting: Urology

## 2024-05-16 ENCOUNTER — Encounter: Payer: Self-pay | Admitting: Urology

## 2024-05-16 VITALS — BP 140/62 | HR 30 | Ht 69.0 in | Wt 147.0 lb

## 2024-05-16 DIAGNOSIS — N138 Other obstructive and reflux uropathy: Secondary | ICD-10-CM

## 2024-05-16 DIAGNOSIS — R310 Gross hematuria: Secondary | ICD-10-CM

## 2024-05-16 DIAGNOSIS — N401 Enlarged prostate with lower urinary tract symptoms: Secondary | ICD-10-CM

## 2024-05-16 DIAGNOSIS — R972 Elevated prostate specific antigen [PSA]: Secondary | ICD-10-CM

## 2024-05-16 DIAGNOSIS — Z87448 Personal history of other diseases of urinary system: Secondary | ICD-10-CM | POA: Diagnosis not present

## 2024-05-16 DIAGNOSIS — N281 Cyst of kidney, acquired: Secondary | ICD-10-CM

## 2024-05-16 DIAGNOSIS — Z8042 Family history of malignant neoplasm of prostate: Secondary | ICD-10-CM | POA: Diagnosis not present

## 2024-05-16 LAB — URINALYSIS, ROUTINE W REFLEX MICROSCOPIC
Bilirubin, UA: NEGATIVE
Glucose, UA: NEGATIVE
Ketones, UA: NEGATIVE
Leukocytes,UA: NEGATIVE
Nitrite, UA: NEGATIVE
Protein,UA: NEGATIVE
RBC, UA: NEGATIVE
Specific Gravity, UA: 1.015 (ref 1.005–1.030)
Urobilinogen, Ur: 0.2 mg/dL (ref 0.2–1.0)
pH, UA: 6.5 (ref 5.0–7.5)

## 2024-05-16 NOTE — Progress Notes (Signed)
 Assessment: 1. Gross hematuria - negative evaluation 2/25   2. Elevated PSA; negative biopsy 6/24   3. Family history of prostate cancer in father   4. BPH with obstruction/lower urinary tract symptoms   5. Renal cyst; left; [place] 1     Plan: PSA today Return to office in 6 months  Chief Complaint:  Chief Complaint  Patient presents with   Hematuria    History of Present Illness:  Isaac Chan is a 68 y.o. male who is seen for continued evaluation of gross hematuria.  Gross hematuria: He had an episode of gross hematuria approximately 3 weeks ago.  The hematuria lasted for approximately 4 days and was intermittent.  He noted dark red urine without clots.  No associated symptoms including dysuria or flank pain.  The hematuria resolved spontaneously.  He has had no further episodes. He does have a prior history of tobacco use. IPSS = 9. Urinalysis showed no evidence of RBCs. CT hematuria protocol from 06/26/2023 showed no solid renal masses, simple appearing left renal cyst, no renal or ureteral calculi, no evidence of obstruction or filling defect. Cystoscopy from 07/16/2023 showed lateral lobe enlargement of the prostate with a small median lobe, no mucosal abnormalities.  Urine cytology was negative for malignancy. At the time of his visit in June 2025, he had not had any further episodes of gross hematuria.  No dysuria or flank pain.    He reports a episode approximately 2 weeks ago of some pink-tinged urine.  This resolved after 1 void.  No other episodes of gross hematuria.  Elevated PSA: He was found to have an elevated PSA. PSA results: 5/23 1.4 5/24 5.2 with 17.3% free  No prior history of elevated PSAs.  No history of UTIs or prostatitis. He does have a family history of prostate cancer with his father. He has lower urinary tract symptoms including frequency, urgency, nocturia x 2 and an intermittent stream.  No dysuria or gross hematuria. IPSS = 15.  He  underwent a transrectal ultrasound and biopsy of the prostate on 11/26/22. TRUS volume:  34.7 ml  PSA density:  0.15 Biopsy results:             Benign prostate tissue, PIN left mid gland  Complications after biopsy: none  PSA 12/24:  2.3 PSA 6/25:  3.1  BPH with LUTS: At his visit in 12/24, he reported some frequency, urgency, and nocturia.  No dysuria or gross hematuria. IPSS = 8. At his visit in 6/25, his lower urinary tract symptoms remain stable. IPSS = 7/1  His lower urinary tract symptoms are stable.  He reports some occasional frequency and nocturia x 2. IPSS = 5/1.  Portions of the above documentation were copied from a prior visit for review purposes only.  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   Dizziness 05/23/2020   Dyspnea on exertion 05/23/2020   Essential hypertension 05/23/2020   Hyperlipidemia    Shoulder injury, left, initial encounter 07/02/2017   Smoking 05/23/2020    Past Surgical History:  Past Surgical History:  Procedure Laterality Date   COLONOSCOPY     R wrist fx repaired     SHOULDER ARTHROSCOPY Left     Allergies:  No Known Allergies  Family History:  Family History  Problem Relation Age of Onset   Prostate cancer Father    Colon cancer Neg Hx    Colon polyps Neg Hx    Esophageal  cancer Neg Hx    Stomach cancer Neg Hx    Rectal cancer Neg Hx     Social History:  Social History   Tobacco Use   Smoking status: Former    Current packs/day: 0.00    Average packs/day: 0.3 packs/day for 46.0 years (11.5 ttl pk-yrs)    Types: Cigarettes    Start date: 07/10/1975    Quit date: 07/09/2021    Years since quitting: 2.8   Smokeless tobacco: Never  Vaping Use   Vaping status: Never Used  Substance Use Topics   Alcohol use: Yes    Comment: once day-beer   Drug use: Yes    Types: Marijuana    Comment: daily, last was 10/02/21 this week    ROS: Constitutional:  Negative for fever,  chills, weight loss CV: Negative for chest pain, previous MI, hypertension Respiratory:  Negative for shortness of breath, wheezing, sleep apnea, frequent cough GI:  Negative for nausea, vomiting, bloody stool, GERD  Physical exam: BP (!) 140/62   Pulse (!) 30   Ht 5' 9 (1.753 m)   Wt 147 lb (66.7 kg)   BMI 21.71 kg/m  GENERAL APPEARANCE:  Well appearing, well developed, well nourished, NAD HEENT:  Atraumatic, normocephalic, oropharynx clear NECK:  Supple without lymphadenopathy or thyromegaly ABDOMEN:  Soft, non-tender, no masses EXTREMITIES:  Moves all extremities well, without clubbing, cyanosis, or edema NEUROLOGIC:  Alert and oriented x 3, normal gait, CN II-XII grossly intact MENTAL STATUS:  appropriate BACK:  Non-tender to palpation, No CVAT SKIN:  Warm, dry, and intact   Results: U/A: negative

## 2024-05-17 ENCOUNTER — Ambulatory Visit: Payer: Self-pay | Admitting: Urology

## 2024-05-17 LAB — PSA: Prostate Specific Ag, Serum: 1.8 ng/mL (ref 0.0–4.0)

## 2024-07-19 ENCOUNTER — Ambulatory Visit: Payer: PPO

## 2024-09-27 ENCOUNTER — Ambulatory Visit: Admitting: Family Medicine
